# Patient Record
Sex: Female | Born: 1985 | Race: White | Hispanic: No | Marital: Married | Smoking: Former smoker
Health system: Southern US, Community
[De-identification: ages and names within clinical notes are randomized; demographics above are authoritative.]

## PROBLEM LIST (undated history)

## (undated) ENCOUNTER — Inpatient Hospital Stay (HOSPITAL_COMMUNITY): Payer: Self-pay

## (undated) DIAGNOSIS — G43909 Migraine, unspecified, not intractable, without status migrainosus: Secondary | ICD-10-CM

## (undated) DIAGNOSIS — R569 Unspecified convulsions: Secondary | ICD-10-CM

## (undated) HISTORY — DX: Migraine, unspecified, not intractable, without status migrainosus: G43.909

## (undated) HISTORY — PX: CHOLECYSTECTOMY: SHX55

## (undated) HISTORY — DX: Unspecified convulsions: R56.9

## (undated) HISTORY — PX: TUBAL LIGATION: SHX77

---
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
## 2007-05-01 ENCOUNTER — Emergency Department (HOSPITAL_COMMUNITY): Admission: EM | Admit: 2007-05-01 | Discharge: 2007-05-01 | Payer: Self-pay | Admitting: Emergency Medicine

## 2009-02-16 ENCOUNTER — Emergency Department (HOSPITAL_COMMUNITY): Admission: EM | Admit: 2009-02-16 | Discharge: 2009-02-16 | Payer: Self-pay | Admitting: Emergency Medicine

## 2009-09-29 IMAGING — CR DG ANKLE COMPLETE 3+V*R*
3 series · 3 of 3 positions shown · non-contrast
Comparison: None

CLINICAL DATA: Right ankle pain after fall next field none

RIGHT ANKLE - COMPLETE 3+ VIEW

[t ankle joint ap right]
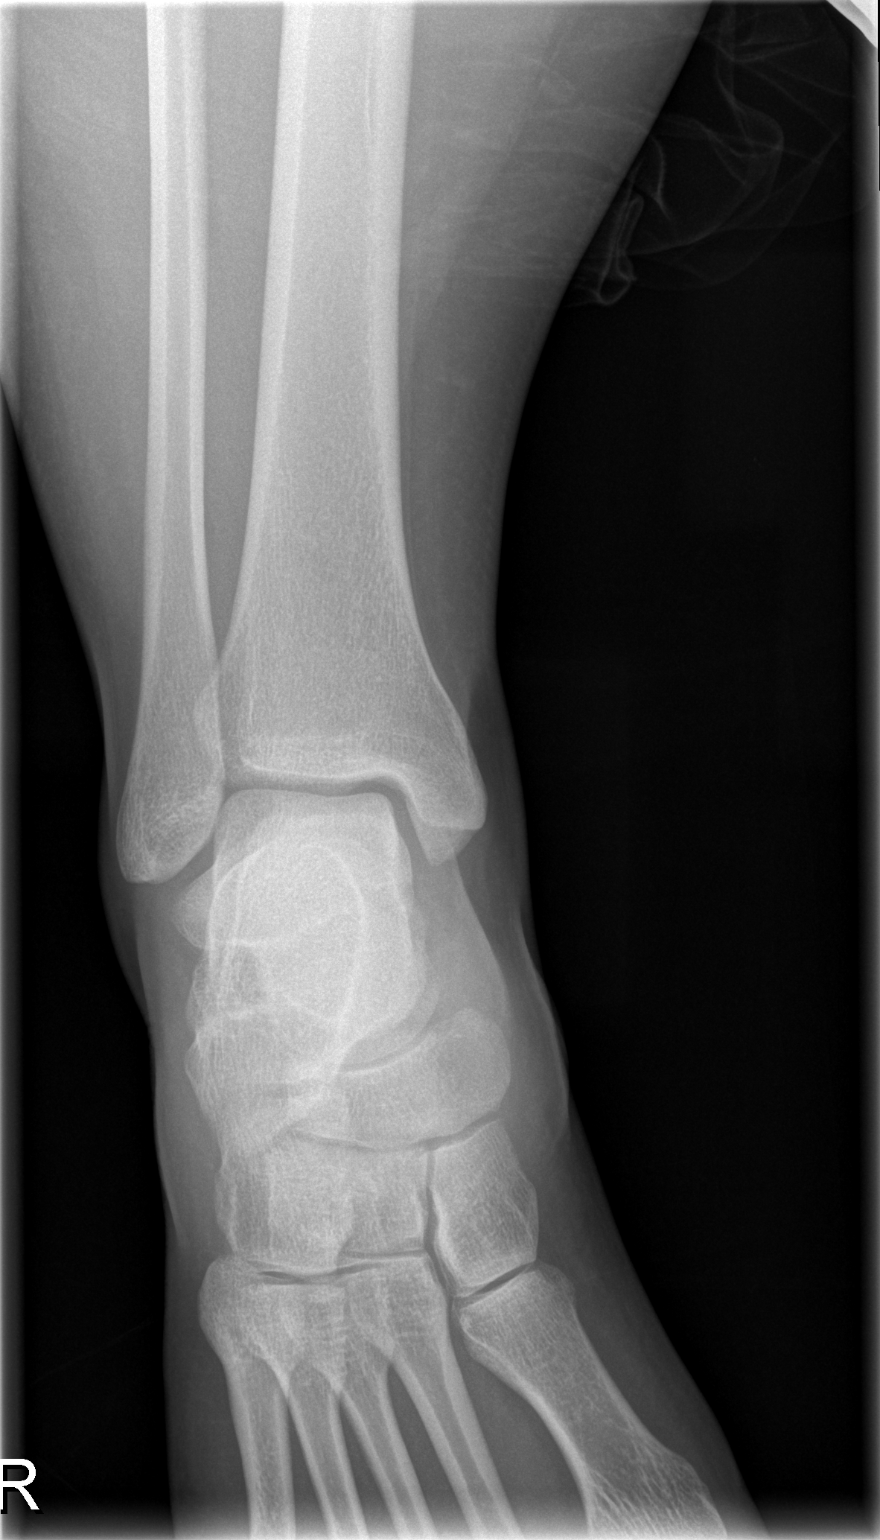

[t ankle joint oblique right]
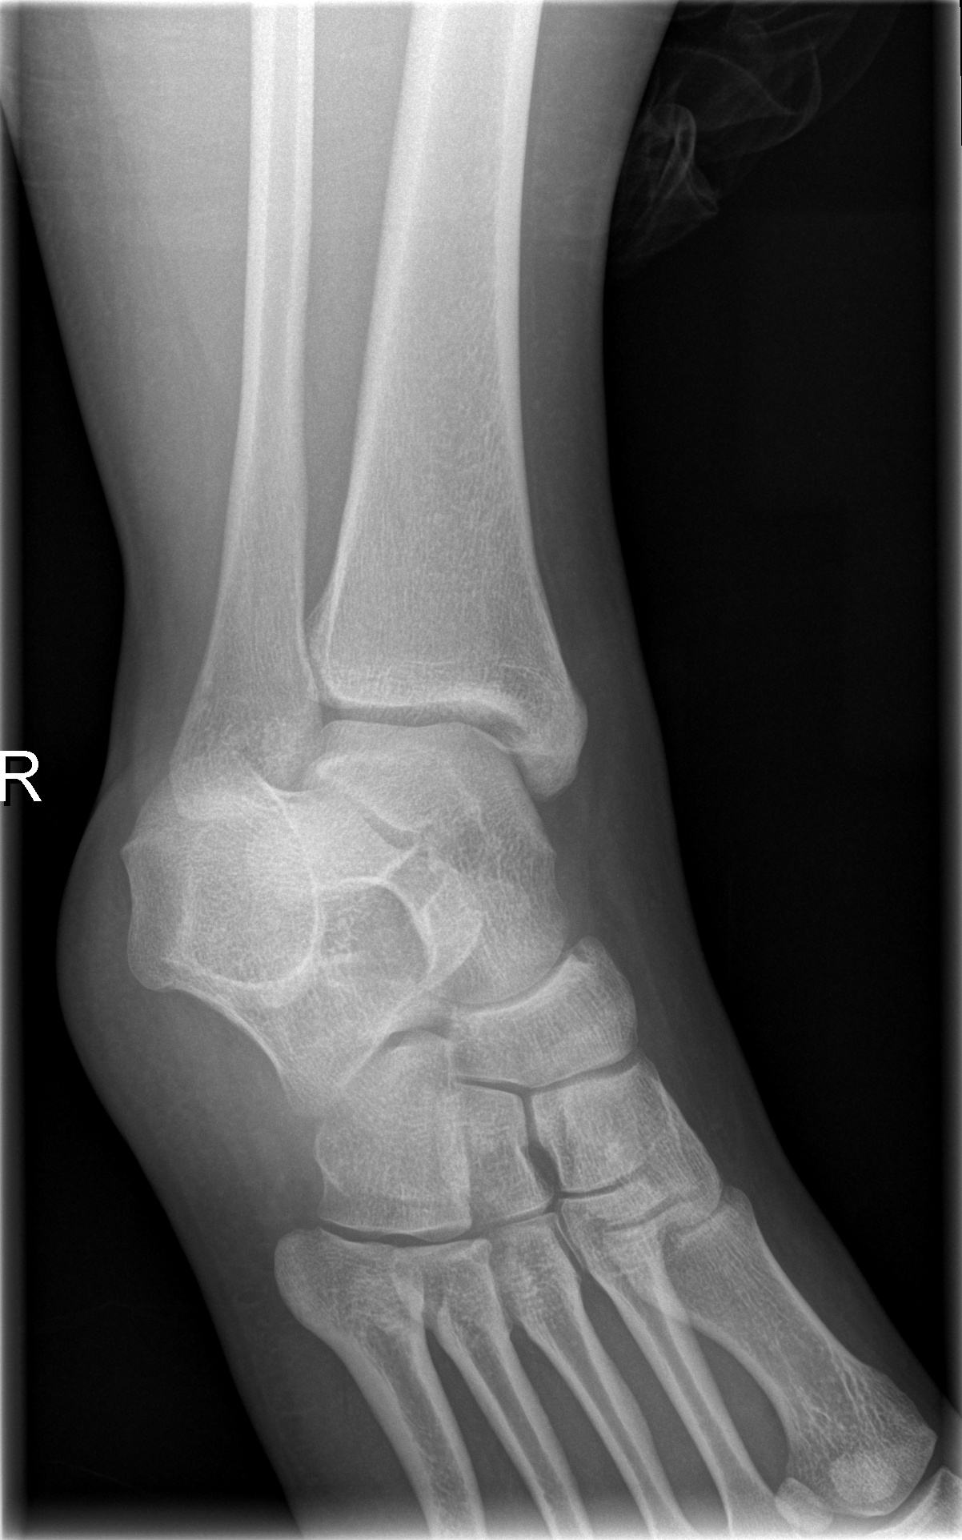

[t ankle joint lat right]
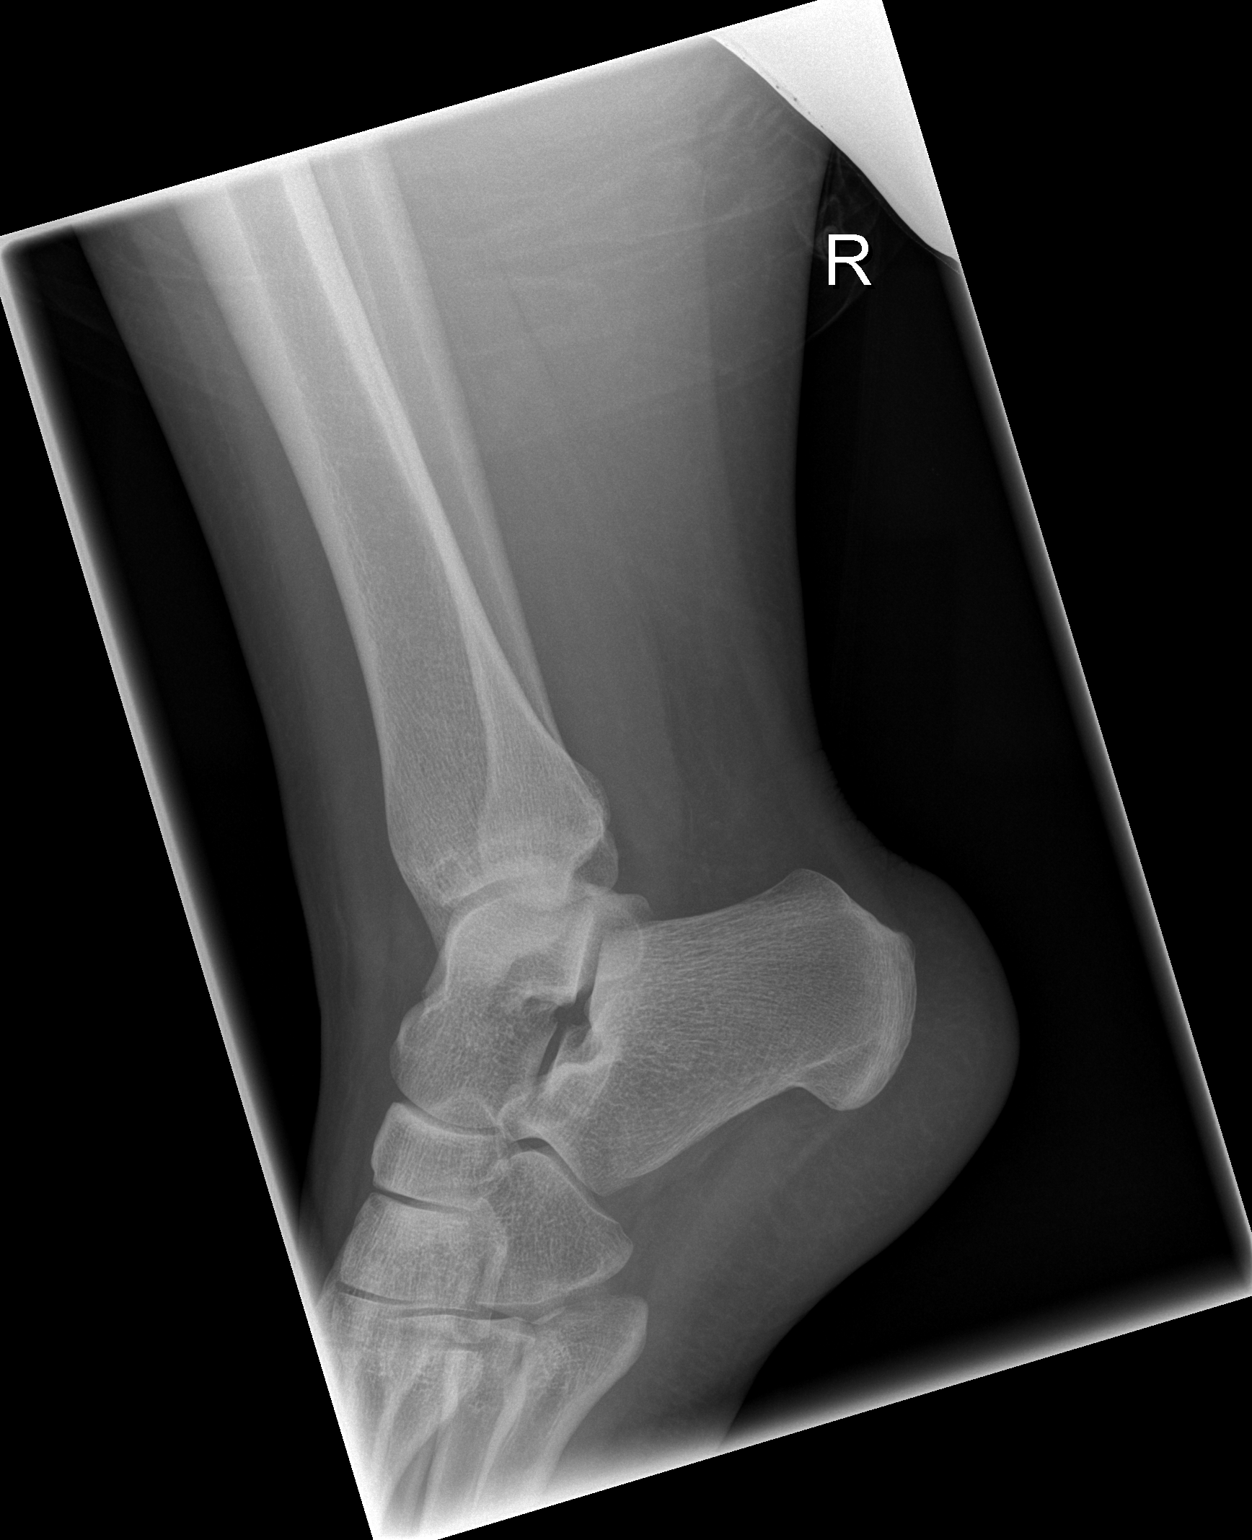

[3 of 3 positions shown; findings below may reference images not displayed]

FINDINGS: No acute fracture or dislocation.  Soft tissues are
unremarkable.
IMPRESSION: No acute findings.

## 2016-10-26 DIAGNOSIS — Z6841 Body Mass Index (BMI) 40.0 and over, adult: Secondary | ICD-10-CM | POA: Insufficient documentation

## 2017-07-05 ENCOUNTER — Encounter: Payer: Self-pay | Admitting: Diagnostic Neuroimaging

## 2017-07-05 ENCOUNTER — Ambulatory Visit (INDEPENDENT_AMBULATORY_CARE_PROVIDER_SITE_OTHER): Payer: Medicaid Other | Admitting: Diagnostic Neuroimaging

## 2017-07-05 VITALS — BP 124/90 | HR 72 | Ht 62.0 in | Wt 258.2 lb

## 2017-07-05 DIAGNOSIS — G40909 Epilepsy, unspecified, not intractable, without status epilepticus: Secondary | ICD-10-CM | POA: Diagnosis not present

## 2017-07-05 MED ORDER — LEVETIRACETAM 500 MG PO TABS: 500.0000 mg | ORAL_TABLET | Freq: Two times a day (BID) | ORAL | 12 refills | Status: DC

## 2017-07-05 NOTE — Progress Notes (Signed)
[facility]  PATIENT: Bethany Fleming DOB: Feb 23, 1986  REFERRING CLINICIAN: Brigitte Pulse, MD HISTORY FROM: patient and husband REASON FOR VISIT: new consult    HISTORICAL  CHIEF COMPLAINT:  Chief Complaint  Patient presents with  . Seizures    rm 6, New Pt, husband- [name], "seizures x 2 years, never been on seizure medication; mainly with stress; head feels like it is tingling beofre the seizure"    HISTORY OF PRESENT ILLNESS:   31 year old female here for evaluation of seizures.  Since 2016 patient has had 10-15 episodes of loss of consciousness with convulsions. She has had other episodes of memory lapses as well. These occur during daytime or while she is sleeping. Patient had a seizure during sleep, which patient's husband witnessed in July 2018; she then was staring, blinking and had a second seizure, before going to the emergency room, and was diagnosed with possible new onset seizure and referred to PCP. Patient was then referred to neurology as well. Patient has family history of seizure in her own mother and son. No specific triggering or aggravating factors. Last Tuesday patient had an episode where she will remembers getting into the shower, but woke up on the bottom of the shower with her dog standing over her licking her face. Patient is not sure how she passed out. She had soreness on her head and thinks she hit it when she fell down.   REVIEW OF SYSTEMS: Full 14 system review of systems performed and negative with exception of: Sleepiness headache seizure passing out not asleep decreased energy change in appetite increased thirst weight loss fatigue.  ALLERGIES: Allergies  Allergen Reactions  . Citrullus Vulgaris Anaphylaxis    unkown  . Prunus Persica Anaphylaxis    unknown  . Tomato Anaphylaxis    unknown    HOME MEDICATIONS: No outpatient prescriptions prior to visit.   No facility-administered medications prior to visit.     PAST  MEDICAL HISTORY: Past Medical History:  Diagnosis Date  . Migraine   . Seizures (HCC)    most recent possibly 06/28/17    PAST SURGICAL HISTORY: Past Surgical History:  Procedure Laterality Date  . CHOLECYSTECTOMY      FAMILY HISTORY: Family History  Problem Relation Age of Onset  . Cancer Mother        breast, lung  . Diabetes Mother   . Seizures Mother   . Stroke Father   . Cancer Father        hepatic  . Cerebral palsy Sister     SOCIAL HISTORY:  Social History   Social History  . Marital status: Married    Spouse name: [name]  . Number of children: 5  . Years of education: N/A   Occupational History  .      NA   Social History Main Topics  . Smoking status: Former Smoker    Types: Cigarettes    Start date: 07/05/2010    Quit date: 07/05/2012  . Smokeless tobacco: Never Used  . Alcohol use No  . Drug use: No  . Sexual activity: Not on file   Other Topics Concern  . Not on file   Social History Narrative   Lives with family   Caffeine- sometimes     PHYSICAL EXAM  GENERAL EXAM/CONSTITUTIONAL: Vitals:  Vitals:   07/05/17 1103  BP: 124/90  Pulse: 72  Weight: 258 lb 3.2 oz (117.1 kg)  Height:  (1.575 m)     Body mass  index is 47.23 kg/m.  Visual Acuity Screening   Right eye Left eye Both eyes  Without correction: 20/70 20/100   With correction:     Comments: 07/05/17 has glasses, not with her    Patient is in no distress; well developed, nourished and groomed; neck is supple  CARDIOVASCULAR:  Examination of carotid arteries is normal; no carotid bruits  Regular rate and rhythm, no murmurs  Examination of peripheral vascular system by observation and palpation is normal  EYES:  Ophthalmoscopic exam of optic discs and posterior segments is normal; no papilledema or hemorrhages  MUSCULOSKELETAL:  Gait, strength, tone, movements noted in Neurologic exam below  NEUROLOGIC: MENTAL STATUS:  No flowsheet data  found.  awake, alert, oriented to person, place and time  recent and remote memory intact  normal attention and concentration  language fluent, comprehension intact, naming intact,   fund of knowledge appropriate  CRANIAL NERVE:   2nd - no papilledema on fundoscopic exam  2nd, 3rd, 4th, 6th - pupils equal and reactive to light, visual fields full to confrontation, extraocular muscles intact, no nystagmus  5th - facial sensation symmetric  7th - facial strength symmetric  8th - hearing intact  9th - palate elevates symmetrically, uvula midline  11th - shoulder shrug symmetric  12th - tongue protrusion midline  MOTOR:   normal bulk and tone, full strength in the BUE, BLE  SENSORY:   normal and symmetric to light touch, temperature, vibration  COORDINATION:   finger-nose-finger, fine finger movements normal  REFLEXES:   deep tendon reflexes present and symmetric  GAIT/STATION:   narrow based gait; romberg is negative    DIAGNOSTIC DATA (LABS, IMAGING, TESTING) - I reviewed patient records, labs, notes, testing and imaging myself where available.  No results found for: WBC, HGB, HCT, MCV, PLT No results found for: NA, K, CL, CO2, GLUCOSE, BUN, CREATININE, CALCIUM, PROT, ALBUMIN, AST, ALT, ALKPHOS, BILITOT, GFRNONAA, GFRAA No results found for: CHOL, HDL, LDLCALC, LDLDIRECT, TRIG, CHOLHDL No results found for: ZOXW9U No results found for: VITAMINB12 No results found for: TSH   04/25/17 CT head [I reviewed images myself and agree with interpretation. -VRP]  - no acute findings    ASSESSMENT AND PLAN  31 y.o. year old female here with multiple episodes of loss of consciousness with convulsions, postictal confusion since 2016, consistent with seizure disorder.   Dx: seizure disorder  1. Seizure disorder (HCC)      PLAN:  - check MRI brain and EEG  - start levetiracetam  twice a day  - According to [place] law, you can not drive unless you  are seizure / syncope free for at least 6 months and under physician's care.   - Please maintain precautions. Do not participate in activities where a loss of awareness could harm you or someone else. No swimming alone, no tub bathing, no hot tubs, no driving, no operating motorized vehicles (cars, ATVs, motocycles, etc), lawnmowers, power tools or firearms. No standing at heights, such as rooftops, ladders or stairs. Avoid hot objects such as stoves, heaters, open fires. Wear a helmet when riding a bicycle, scooter, skateboard, etc. and avoid areas of traffic. Set your water heater to 120 degrees or less.  Orders Placed This Encounter  Procedures  . MR BRAIN W WO CONTRAST  . EEG adult   Meds ordered this encounter  Medications  . levETIRAcetam (KEPPRA) 500 MG tablet    Sig: Take 1 tablet (500 mg total) by mouth 2 (two)  times daily.    Dispense:  60 tablet    Refill:  12   Return in about 3 months (around 10/05/2017).  I reviewed images, labs, notes, records myself. I summarized findings and reviewed with patient, for this high risk condition (seizure disorder) requiring high complexity decision making.    Suanne Marker, MD 07/05/2017, 11:58 AM Certified in Neurology, Neurophysiology and Neuroimaging  [facility] [address], [address] [place], [place] [address] [phone]

## 2017-07-05 NOTE — Patient Instructions (Signed)
-   check MRI brain and EEG  - start levetiracetam  twice a day  - According to [place] law, you can not drive unless you are seizure / syncope free for at least 6 months and under physician's care.   - Please maintain precautions. Do not participate in activities where a loss of awareness could harm you or someone else. No swimming alone, no tub bathing, no hot tubs, no driving, no operating motorized vehicles (cars, ATVs, motocycles, etc), lawnmowers, power tools or firearms. No standing at heights, such as rooftops, ladders or stairs. Avoid hot objects such as stoves, heaters, open fires. Wear a helmet when riding a bicycle, scooter, skateboard, etc. and avoid areas of traffic. Set your water heater to 120 degrees or less.

## 2017-07-17 ENCOUNTER — Other Ambulatory Visit: Payer: Self-pay

## 2017-07-19 ENCOUNTER — Inpatient Hospital Stay (HOSPITAL_COMMUNITY)
Admission: AD | Admit: 2017-07-19 | Discharge: 2017-07-19 | Disposition: A | Discharge status: Home / Self Care | Payer: Medicaid Other | Source: Ambulatory Visit | Attending: Obstetrics and Gynecology | Admitting: Obstetrics and Gynecology

## 2017-07-19 ENCOUNTER — Encounter (HOSPITAL_COMMUNITY): Payer: Self-pay | Admitting: *Deleted

## 2017-07-19 DIAGNOSIS — G40909 Epilepsy, unspecified, not intractable, without status epilepticus: Secondary | ICD-10-CM | POA: Diagnosis not present

## 2017-07-19 DIAGNOSIS — R1010 Upper abdominal pain, unspecified: Secondary | ICD-10-CM | POA: Insufficient documentation

## 2017-07-19 DIAGNOSIS — O26899 Other specified pregnancy related conditions, unspecified trimester: Secondary | ICD-10-CM

## 2017-07-19 DIAGNOSIS — O26891 Other specified pregnancy related conditions, first trimester: Secondary | ICD-10-CM | POA: Insufficient documentation

## 2017-07-19 DIAGNOSIS — Z3A01 Less than 8 weeks gestation of pregnancy: Secondary | ICD-10-CM | POA: Insufficient documentation

## 2017-07-19 DIAGNOSIS — O99351 Diseases of the nervous system complicating pregnancy, first trimester: Secondary | ICD-10-CM | POA: Insufficient documentation

## 2017-07-19 DIAGNOSIS — O3680X0 Pregnancy with inconclusive fetal viability, not applicable or unspecified: Secondary | ICD-10-CM

## 2017-07-19 DIAGNOSIS — R109 Unspecified abdominal pain: Secondary | ICD-10-CM

## 2017-07-19 DIAGNOSIS — Z87891 Personal history of nicotine dependence: Secondary | ICD-10-CM | POA: Diagnosis not present

## 2017-07-19 LAB — URINALYSIS, ROUTINE W REFLEX MICROSCOPIC
Bilirubin Urine: NEGATIVE
Glucose, UA: NEGATIVE mg/dL
Hgb urine dipstick: NEGATIVE
KETONES UR: NEGATIVE mg/dL
Nitrite: NEGATIVE
PROTEIN: NEGATIVE mg/dL
Specific Gravity, Urine: 1.015 (ref 1.005–1.030)
pH: 5 (ref 5.0–8.0)

## 2017-07-19 LAB — CBC
HCT: 39.6 % (ref 36.0–46.0)
Hemoglobin: 13.4 g/dL (ref 12.0–15.0)
MCH: 28.5 pg (ref 26.0–34.0)
MCHC: 33.8 g/dL (ref 30.0–36.0)
MCV: 84.1 fL (ref 78.0–100.0)
PLATELETS: 231 10*3/uL (ref 150–400)
RBC: 4.71 MIL/uL (ref 3.87–5.11)
RDW: 12.8 % (ref 11.5–15.5)
WBC: 6.9 10*3/uL (ref 4.0–10.5)

## 2017-07-19 LAB — WET PREP, GENITAL
Clue Cells Wet Prep HPF POC: NONE SEEN
SPERM: NONE SEEN
TRICH WET PREP: NONE SEEN
YEAST WET PREP: NONE SEEN

## 2017-07-19 LAB — POCT PREGNANCY, URINE: PREG TEST UR: POSITIVE — AB

## 2017-07-19 LAB — HCG, QUANTITATIVE, PREGNANCY: hCG, Beta Chain, Quant, S: 46 m[IU]/mL — ABNORMAL HIGH (ref <5)

## 2017-07-19 MED ORDER — ACETAMINOPHEN 500 MG PO TABS: 1000.0000 mg | ORAL_TABLET | Freq: Four times a day (QID) | ORAL | 0 refills | Status: DC | PRN

## 2017-07-19 NOTE — MAU Note (Addendum)
States has been taking oral BCP and generic Kepra. Expresses concern over having +HPT Wants to make sure she is pregnant and that everything is ok.  Pain in upper mid abdomen Dull constant pain Rating pain 4/10  +N/V

## 2017-07-19 NOTE — Progress Notes (Signed)
BP 139/74 (BP Location: Right Arm)   Pulse 72   Temp 98.1 F (36.7 C)   Resp 18   Wt 116.2 kg (256 lb 1.9 oz)   LMP 05/18/2017   SpO2 100%   BMI 46.84 kg/m   Results for orders placed or performed during the hospital encounter of 07/19/17 (from the past 24 hour(s))  Urinalysis, Routine w reflex microscopic     Status: Abnormal   Collection Time: 07/19/17  2:49 PM  Result Value Ref Range   Color, Urine YELLOW YELLOW   APPearance HAZY (A) CLEAR   Specific Gravity, Urine 1.015 1.005 - 1.030   pH 5.0 5.0 - 8.0   Glucose, UA NEGATIVE NEGATIVE mg/dL   Hgb urine dipstick NEGATIVE NEGATIVE   Bilirubin Urine NEGATIVE NEGATIVE   Ketones, ur NEGATIVE NEGATIVE mg/dL   Protein, ur NEGATIVE NEGATIVE mg/dL   Nitrite NEGATIVE NEGATIVE   Leukocytes, UA SMALL (A) NEGATIVE   RBC / HPF 0-5 0 - 5 RBC/hpf   WBC, UA 6-30 0 - 5 WBC/hpf   Bacteria, UA RARE (A) NONE SEEN   Squamous Epithelial / LPF 6-30 (A) NONE SEEN   Mucus PRESENT   Pregnancy, urine POC     Status: Abnormal   Collection Time: 07/19/17  3:10 PM  Result Value Ref Range   Preg Test, Ur POSITIVE (A) NEGATIVE  Wet prep, genital     Status: Abnormal   Collection Time: 07/19/17  3:42 PM  Result Value Ref Range   Yeast Wet Prep HPF POC NONE SEEN NONE SEEN   Trich, Wet Prep NONE SEEN NONE SEEN   Clue Cells Wet Prep HPF POC NONE SEEN NONE SEEN   WBC, Wet Prep HPF POC MODERATE (A) NONE SEEN   Sperm NONE SEEN   hCG, quantitative, pregnancy     Status: Abnormal   Collection Time: 07/19/17  4:16 PM  Result Value Ref Range   hCG, Beta Chain, Quant, S 46 (H) <5 mIU/mL  CBC     Status: None   Collection Time: 07/19/17  4:16 PM  Result Value Ref Range   WBC 6.9 4.0 - 10.5 K/uL   RBC 4.71 3.87 - 5.11 MIL/uL   Hemoglobin 13.4 12.0 - 15.0 g/dL   HCT 16.1 09.6 - 04.5 %   MCV 84.1 78.0 - 100.0 fL   MCH 28.5 26.0 - 34.0 pg   MCHC 33.8 30.0 - 36.0 g/dL   RDW 40.9 81.1 - 91.4 %   Platelets 231 150 - 400 K/uL   I reviewed the implication of  a quantitative hCG of 46. She understands that she may be very early pregnant. She also understands that she may be having a miscarriage or some other complication of her pregnancy. We will follow the patient with quantitative hCG values every 48 hours. She will have her blood drawn in [place], [place]. She will return to our office in 2 weeks.

## 2017-07-19 NOTE — MAU Provider Note (Signed)
DATE: 07/19/2017  Maternity Admissions Unit History and Physical Exam for an Obstetrics Patient  [name] is a 31 y.o. female, G6P4100, at Unknown gestation, who presents for evaluation of upper abdominal pain and pregnancy of uncertain gestation. She has been followed at the [facility] and Gynecology division of [facility]. She conceived while on birth control pills. Her blood type is O+.  OB History    Gravida Para Term Preterm AB Living   SAB TAB Ectopic Multiple Live Births                  Past Medical History:  Diagnosis Date  . Migraine   . Seizures (HCC)    most recent possibly 06/28/17    Prescriptions Prior to Admission  Medication Sig Dispense Refill Last Dose  . levETIRAcetam (KEPPRA) 500 MG tablet Take 1 tablet (500 mg total) by mouth 2 (two) times daily. 60 tablet 12   . ondansetron (ZOFRAN-ODT) 4 MG disintegrating tablet Take 4 mg by mouth.   Taking    Past Surgical History:  Procedure Laterality Date  . CHOLECYSTECTOMY      Allergies  Allergen Reactions  . Citrullus Vulgaris Anaphylaxis    unkown  . Prunus Persica Anaphylaxis    unknown  . Tomato Anaphylaxis    unknown    Family History: family history includes Cancer in her father and mother; Cerebral palsy in her sister; Diabetes in her mother; Seizures in her mother; Stroke in her father.  Social History:  reports that she quit smoking about 5 years ago. Her smoking use included Cigarettes. She started smoking about 7 years ago. She has never used smokeless tobacco. She reports that she does not drink alcohol or use drugs.  Review of systems: Normal pregnancy complaints.  Admission Physical Exam:  Dilation: Closed Exam by:: Stefano Gaul, MD  Body mass index is 46.84 kg/m.  Blood pressure 139/74, pulse 80, temperature 98.2 F (36.8 C), temperature source Oral, resp. rate 18, weight 116.2 kg (256 lb 1.9 oz), last menstrual period  05/18/2017, SpO2 100 %.  HEENT:                 Within normal limits Chest:                   Clear Heart:                    Regular rate and rhythm Abdomen:             Gravid and nontender Extremities:          Grossly normal Neurologic exam: Grossly normal Pelvic exam:         Cervix: closed and long The uterus is normal size as best I can tell (exam is limited by BMI)  Assessment:  4 week and 3 day gestation by uncertain last menstrual period  Vague upper abdominal pain  Seizure disorder  Desire to know gestational age  Plan:  We will check a CBC, quantitative hCG, and possible ultrasound.  The patient will continue her current seizure medication. She will speak with her neurologist as soon as possible to plan to transition to a a seizure medication that is most compatible with pregnancy.   Janine Limbo 07/19/2017, 4:12 PM

## 2017-07-19 NOTE — Progress Notes (Signed)
Dr Stefano Gaul in to discuss test results and d/c plan.

## 2017-07-19 NOTE — Discharge Instructions (Signed)
Vaginal Bleeding During Pregnancy, First Trimester °A small amount of bleeding (spotting) from the vagina is common in early pregnancy. Sometimes the bleeding is normal and is not a problem, and sometimes it is a sign of something serious. Be sure to tell your doctor about any bleeding from your vagina right away. °Follow these instructions at home: °· Watch your condition for any changes. °· Follow your doctor's instructions about how active you can be. °· If you are on bed rest: °? You may need to stay in bed and only get up to use the bathroom. °? You may be allowed to do some activities. °? If you need help, make plans for someone to help you. °· Write down: °? The number of pads you use each day. °? How often you change pads. °? How soaked (saturated) your pads are. °· Do not use tampons. °· Do not douche. °· Do not have sex or orgasms until your doctor says it is okay. °· If you pass any tissue from your vagina, save the tissue so you can show it to your doctor. °· Only take medicines as told by your doctor. °· Do not take aspirin because it can make you bleed. °· Keep all follow-up visits as told by your doctor. °Contact a doctor if: °· You bleed from your vagina. °· You have cramps. °· You have labor pains. °· You have a fever that does not go away after you take medicine. °Get help right away if: °· You have very bad cramps in your back or belly (abdomen). °· You pass large clots or tissue from your vagina. °· You bleed more. °· You feel light-headed or weak. °· You pass out (faint). °· You have chills. °· You are leaking fluid or have a gush of fluid from your vagina. °· You pass out while pooping (having a bowel movement). °This information is not intended to replace advice given to you by your health care provider. Make sure you discuss any questions you have with your health care provider. °Document Released: 02/04/2014 Document Revised: 02/26/2016 Document Reviewed: 05/28/2013 °Elsevier Interactive  Patient Education © 2018 Elsevier Inc. ° °

## 2017-07-19 NOTE — Progress Notes (Signed)
Written and verbal d/c instructions given and understanding voiced. 

## 2017-07-20 ENCOUNTER — Other Ambulatory Visit: Payer: Medicaid Other

## 2017-07-20 LAB — CULTURE, OB URINE

## 2017-07-20 LAB — GC/CHLAMYDIA PROBE AMP (~~LOC~~) NOT AT ARMC
Chlamydia: NEGATIVE
NEISSERIA GONORRHEA: NEGATIVE

## 2017-07-22 ENCOUNTER — Inpatient Hospital Stay (HOSPITAL_COMMUNITY)
Admission: AD | Admit: 2017-07-22 | Discharge: 2017-07-22 | Disposition: A | Discharge status: Home / Self Care | Payer: Medicaid Other | Source: Ambulatory Visit | Attending: Obstetrics and Gynecology | Admitting: Obstetrics and Gynecology

## 2017-07-22 DIAGNOSIS — Z3A01 Less than 8 weeks gestation of pregnancy: Secondary | ICD-10-CM | POA: Diagnosis not present

## 2017-07-22 DIAGNOSIS — O26891 Other specified pregnancy related conditions, first trimester: Secondary | ICD-10-CM | POA: Diagnosis not present

## 2017-07-22 LAB — HCG, QUANTITATIVE, PREGNANCY: HCG, BETA CHAIN, QUANT, S: 145 m[IU]/mL — AB (ref <5)

## 2017-07-22 NOTE — MAU Note (Signed)
NO PAIN , NO BLEEDING.

## 2017-08-03 ENCOUNTER — Telehealth: Payer: Self-pay | Admitting: Diagnostic Neuroimaging

## 2017-08-03 ENCOUNTER — Other Ambulatory Visit: Payer: Self-pay | Admitting: Neurology

## 2017-08-03 DIAGNOSIS — R569 Unspecified convulsions: Secondary | ICD-10-CM

## 2017-08-03 NOTE — Telephone Encounter (Signed)
This is a Dr. Marjory LiesPenumalli patient, Bethany [facility] just called me and informed me that the patient is [redacted] week pregnant and cannot have contrast. When you get a chance can you put a new order to a MRI Brain wo contrast.

## 2017-08-03 NOTE — Telephone Encounter (Signed)
Order is placed.

## 2017-08-03 NOTE — Telephone Encounter (Signed)
Noted thank you

## 2017-08-08 ENCOUNTER — Inpatient Hospital Stay (HOSPITAL_COMMUNITY)
Admission: AD | Admit: 2017-08-08 | Discharge: 2017-08-09 | Disposition: A | Discharge status: Home / Self Care | Payer: Medicaid Other | Source: Ambulatory Visit | Attending: Obstetrics and Gynecology | Admitting: Obstetrics and Gynecology

## 2017-08-08 ENCOUNTER — Encounter (HOSPITAL_COMMUNITY): Payer: Self-pay | Admitting: Certified Nurse Midwife

## 2017-08-08 DIAGNOSIS — Z91018 Allergy to other foods: Secondary | ICD-10-CM | POA: Insufficient documentation

## 2017-08-08 DIAGNOSIS — Z87891 Personal history of nicotine dependence: Secondary | ICD-10-CM | POA: Diagnosis not present

## 2017-08-08 DIAGNOSIS — Z9049 Acquired absence of other specified parts of digestive tract: Secondary | ICD-10-CM | POA: Diagnosis not present

## 2017-08-08 DIAGNOSIS — Z833 Family history of diabetes mellitus: Secondary | ICD-10-CM | POA: Diagnosis not present

## 2017-08-08 DIAGNOSIS — R109 Unspecified abdominal pain: Secondary | ICD-10-CM | POA: Diagnosis not present

## 2017-08-08 DIAGNOSIS — Z823 Family history of stroke: Secondary | ICD-10-CM | POA: Insufficient documentation

## 2017-08-08 DIAGNOSIS — O26891 Other specified pregnancy related conditions, first trimester: Secondary | ICD-10-CM | POA: Insufficient documentation

## 2017-08-08 DIAGNOSIS — R1031 Right lower quadrant pain: Secondary | ICD-10-CM | POA: Insufficient documentation

## 2017-08-08 DIAGNOSIS — Z803 Family history of malignant neoplasm of breast: Secondary | ICD-10-CM | POA: Insufficient documentation

## 2017-08-08 DIAGNOSIS — Z79899 Other long term (current) drug therapy: Secondary | ICD-10-CM | POA: Diagnosis not present

## 2017-08-08 DIAGNOSIS — O99351 Diseases of the nervous system complicating pregnancy, first trimester: Secondary | ICD-10-CM | POA: Insufficient documentation

## 2017-08-08 DIAGNOSIS — G40909 Epilepsy, unspecified, not intractable, without status epilepticus: Secondary | ICD-10-CM | POA: Insufficient documentation

## 2017-08-08 DIAGNOSIS — Z801 Family history of malignant neoplasm of trachea, bronchus and lung: Secondary | ICD-10-CM | POA: Insufficient documentation

## 2017-08-08 DIAGNOSIS — Z820 Family history of epilepsy and other diseases of the nervous system: Secondary | ICD-10-CM | POA: Diagnosis not present

## 2017-08-08 DIAGNOSIS — Z3A08 8 weeks gestation of pregnancy: Secondary | ICD-10-CM | POA: Diagnosis not present

## 2017-08-08 DIAGNOSIS — O219 Vomiting of pregnancy, unspecified: Secondary | ICD-10-CM | POA: Diagnosis present

## 2017-08-08 LAB — URINALYSIS, ROUTINE W REFLEX MICROSCOPIC
Bilirubin Urine: NEGATIVE
GLUCOSE, UA: NEGATIVE mg/dL
HGB URINE DIPSTICK: NEGATIVE
KETONES UR: NEGATIVE mg/dL
NITRITE: NEGATIVE
PH: 6 (ref 5.0–8.0)
PROTEIN: NEGATIVE mg/dL
Specific Gravity, Urine: 1.02 (ref 1.005–1.030)

## 2017-08-08 LAB — WET PREP, GENITAL
Clue Cells Wet Prep HPF POC: NONE SEEN
Sperm: NONE SEEN
Trich, Wet Prep: NONE SEEN
Yeast Wet Prep HPF POC: NONE SEEN

## 2017-08-08 LAB — CBC
HCT: 39.6 % (ref 36.0–46.0)
HEMOGLOBIN: 13.4 g/dL (ref 12.0–15.0)
MCH: 28.5 pg (ref 26.0–34.0)
MCHC: 33.8 g/dL (ref 30.0–36.0)
MCV: 84.3 fL (ref 78.0–100.0)
Platelets: 231 10*3/uL (ref 150–400)
RBC: 4.7 MIL/uL (ref 3.87–5.11)
RDW: 13.1 % (ref 11.5–15.5)
WBC: 7.9 10*3/uL (ref 4.0–10.5)

## 2017-08-08 LAB — HCG, QUANTITATIVE, PREGNANCY: HCG, BETA CHAIN, QUANT, S: 38787 m[IU]/mL — AB (ref <5)

## 2017-08-08 LAB — ABO/RH: ABO/RH(D): O POS

## 2017-08-08 NOTE — MAU Note (Signed)
PT SAYS SHE WAS HERE  2 WEEKS  AGO - FOR  LABS  -  TOLD  TO COME  BACK 11-3   FOR  EDC.     PNC - NO APPOINTMENT  YET.    CRAMPS  STARTED    SAT NIGHT

## 2017-08-08 NOTE — MAU Provider Note (Signed)
History    CSN: [id number] Arrival date and time: 08/08/17 2012    Chief Complaint  Patient presents with  . Abdominal Pain    HPI: Bethany Fleming is a 31 y.o. [phone] [id number] with IUP at [id number] who presents to maternity for evaluation of lower abdominal pain for 2 days. She reports that after twisting and jumping suddenly at work (works at a [employer]) she started feeling sharp pain in rlq with radiation to llq. 4/10 pain not alleviated with tylenol. No vaginal bleeding but has vaginal discharge white, thick that also started 2 days ago w/o itching. She reports some nausea, vomiting.  She stated that she was conceived while on birth control pills (she was compliant with medication)..  Denies contractions, leakage of fluid or vaginal bleeding.  Also denies any fevers, chills, sweats, dysuria or change in bowel movement. She reports that she has not received any PNC due to unable to find provider to manage Seizures in pregnancy.  Pregnancy complicated by Seizures (diagnosed 1 month ago. On keppra with last seizure last week lasting 5-336minutes as reported by family member  Past obstetric history: OB History  Gravida Para Term Preterm AB Living  6 5 4 1   5   SAB TAB Ectopic Multiple Live Births          5    # Outcome Date GA Lbr Len/2nd Weight Sex Delivery Anes PTL Lv  6 Current           5 Preterm 02/14/14    M Vag-Spont   LIV  4 Term 12/27/12    M Vag-Spont   LIV  3 Term 06/25/09    M Vag-Spont   LIV  2 Term 09/26/07    M Vag-Spont   LIV  1 Term 06/25/06    F Vag-Spont   LIV      Past Medical History:  Diagnosis Date  . Migraine   . Seizures (HCC)    most recent possibly 06/28/17    Past Surgical History:  Procedure Laterality Date  . CHOLECYSTECTOMY      Family History  Problem Relation Age of Onset  . Cancer Mother        breast, lung  . Diabetes Mother   . Seizures Mother   . Stroke Father   . Cancer Father        hepatic  . Cerebral palsy Sister     Social  History   Tobacco Use  . Smoking status: Former Smoker    Types: Cigarettes    Start date: 07/05/2010    Last attempt to quit: 07/05/2012    Years since quitting: 5.0  . Smokeless tobacco: Never Used  Substance Use Topics  . Alcohol use: No  . Drug use: No    Allergies:  Allergies  Allergen Reactions  . Other Anaphylaxis    Watermelon, peaches  . Tomato Anaphylaxis    unknown    Medications Prior to Admission  Medication Sig Dispense Refill Last Dose  . levETIRAcetam (KEPPRA) 500 MG tablet Take 1 tablet (500 mg total) by mouth 2 (two) times daily. 60 tablet 12 08/08/2017 at Unknown time  . acetaminophen (TYLENOL) 500 MG tablet Take 2 tablets (1,000 mg total) by mouth every 6 (six) hours as needed for moderate pain. 30 tablet 0   . ondansetron (ZOFRAN-ODT) 4 MG disintegrating tablet Take 4 mg by mouth every 8 (eight) hours as needed for nausea.    Past Week at Unknown time  Review of Systems - Negative except for what is mentioned in HPI.  Physical Exam   Blood pressure 113/67, pulse 71, temperature 98.3 F (36.8 C), temperature source Oral, resp. rate 18, height 5\' 2"  (1.575 m), weight 257 lb 12 oz (116.9 kg), last menstrual period 06/08/2017.  Constitutional: Well-developed, well-nourished female in no acute distress.  HENT: [place]/AT, normal oropharynx mucosa. MMM Eyes: normal conjunctivae, no scleral icterus Cardiovascular: normal rate, regular rhythm Respiratory: normal effort, lungs CTAB.  GI: Abd soft, LLQ tenderness, BS +   GU: Neg CVAT. Pelvic: NEFG, physiologic discharge, no blood, cervix clean. No CMT MSK: Extremities nontender, no edema, normal ROM Neurologic: Alert and oriented x 4. Psych: Normal mood and affect Skin: warm and dry    MAU Course  Procedures  MDM  31 yo f at [redacted]w[redacted]d with recent diagnosis of epilepsy on Keppra. With lower abdominal pain for 2 days without bleeding. Evaluation for abdominal pain in pregnancy, 1st trimester.  --HCG quant, CBC,  T&S -- wet prep and GC/Chlamydia probe amp --Transvaginal and transabdominal pelvic ultrasound.  Assessment and Plan  Assessment: 1. Abdominal pain during pregnancy in first trimester   2. Abdominal pain in pregnancy, first trimester   3. Seizure disorder during pregnancy in first trimester [facility])     Plan: --Discharge home in stable condition. --continue taking prenatal vitamins  --Call to schedule for new ob visit for prenatal care --call neurologist to schedule for follow up on epilepsy (recent seizure w/ medication)  Follow-up Information    [facility] Call.   Specialty:  Obstetrics and Gynecology Why:  call  as soon as possible for 1st prenatal visit Contact information: [address] [place] [address] [phone]           Minott, Lynnae Prude, MD 08/09/2017 1:58 AM   OB FELLOW MAU DISCHARGE ATTESTATION I have seen and examined this patient; I agree with above documentation in the resident's note.  Highlights:  Pt with positive pregnancy test 2 weeks ago, but no prior ultrasound presenting with abdominal pain. On exam, pt nontoxic appearing. VSS.  Work up done including labs and ultrasound to rule out ectopic pregnancy. US showed viable IUP at [redacted]w[redacted]d, and subchorionic hemorrhage. Pt w/o vaginal bleeding at this time. She is Rh+ --Pt d/c'd home in stable condition --Discussed precautions, including vaginal bleeding precautions.  --She has seizure disorder, recent diagnosis, currently on Keppra, had breakthrough seizure about a week ago. She was advised to call neurologist in the morning, for sooner follow up and medication adjustment. --List of OB providers to establish prenatal care.   Frederik Pear, MD OB Fellow 2:35 AM

## 2017-08-09 ENCOUNTER — Inpatient Hospital Stay (HOSPITAL_COMMUNITY): Payer: Medicaid Other

## 2017-08-09 DIAGNOSIS — O26891 Other specified pregnancy related conditions, first trimester: Secondary | ICD-10-CM

## 2017-08-09 DIAGNOSIS — G40909 Epilepsy, unspecified, not intractable, without status epilepticus: Secondary | ICD-10-CM

## 2017-08-09 DIAGNOSIS — O99351 Diseases of the nervous system complicating pregnancy, first trimester: Secondary | ICD-10-CM

## 2017-08-09 DIAGNOSIS — R109 Unspecified abdominal pain: Secondary | ICD-10-CM | POA: Diagnosis not present

## 2017-08-09 LAB — GC/CHLAMYDIA PROBE AMP (~~LOC~~) NOT AT ARMC
Chlamydia: NEGATIVE
Neisseria Gonorrhea: NEGATIVE

## 2017-08-09 NOTE — Discharge Instructions (Signed)
Prenatal Care WHAT IS PRENATAL CARE? Prenatal care is the process of caring for a pregnant woman before she gives birth. Prenatal care makes sure that she and her baby remain as healthy as possible throughout pregnancy. Prenatal care may be provided by a midwife, family practice health care provider, or a childbirth and pregnancy specialist (obstetrician). Prenatal care may include physical examinations, testing, treatments, and education on nutrition, lifestyle, and social support services. WHY IS PRENATAL CARE SO IMPORTANT? Early and consistent prenatal care increases the chance that you and your baby will remain healthy throughout your pregnancy. This type of care also decreases a babys risk of being born too early (prematurely), or being born smaller than expected (small for gestational age). Any underlying medical conditions you may have that could pose a risk during your pregnancy are discussed during prenatal care visits. You will also be monitored regularly for any new conditions that may arise during your pregnancy so they can be treated quickly and effectively. WHAT HAPPENS DURING PRENATAL CARE VISITS? Prenatal care visits may include the following: Discussion Tell your health care provider about any new signs or symptoms you have experienced since your last visit. These might include:  Nausea or vomiting.  Increased or decreased level of energy.  Difficulty sleeping.  Back or leg pain.  Weight changes.  Frequent urination.  Shortness of breath with physical activity.  Changes in your skin, such as the development of a rash or itchiness.  Vaginal discharge or bleeding.  Feelings of excitement or nervousness.  Changes in your babys movements.  You may want to write down any questions or topics you want to discuss with your health care provider and bring them with you to your appointment. Examination During your first prenatal care visit, you will likely have a complete  physical exam. Your health care provider will often examine your vagina, cervix, and the position of your uterus, as well as check your heart, lungs, and other body systems. As your pregnancy progresses, your health care provider will measure the size of your uterus and your babys position inside your uterus. He or she may also examine you for early signs of labor. Your prenatal visits may also include checking your blood pressure and, after about 10-12 weeks of pregnancy, listening to your babys heartbeat. Testing Regular testing often includes:  Urinalysis. This checks your urine for glucose, protein, or signs of infection.  Blood count. This checks the levels of white and red blood cells in your body.  Tests for sexually transmitted infections (STIs). Testing for STIs at the beginning of pregnancy is routinely done and is required in many states.  Antibody testing. You will be checked to see if you are immune to certain illnesses, such as rubella, that can affect a developing fetus.  Glucose screen. Around 24-28 weeks of pregnancy, your blood glucose level will be checked for signs of gestational diabetes. Follow-up tests may be recommended.  Group B strep. This is a bacteria that is commonly found inside a womans vagina. This test will inform your health care provider if you need an antibiotic to reduce the amount of this bacteria in your body prior to labor and childbirth.  Ultrasound. Many pregnant women undergo an ultrasound screening around 18-20 weeks of pregnancy to evaluate the health of the fetus and check for any developmental abnormalities.  HIV (human immunodeficiency virus) testing. Early in your pregnancy, you will be screened for HIV. If you are at high risk for HIV, this test may  be repeated during your third trimester of pregnancy.  You may be offered other testing based on your age, personal or family medical history, or other factors. HOW OFTEN SHOULD I PLAN TO SEE MY  HEALTH CARE PROVIDER FOR PRENATAL CARE? Your prenatal care check-up schedule depends on any medical conditions you have before, or develop during, your pregnancy. If you do not have any underlying medical conditions, you will likely be seen for checkups:  Monthly, during the first 6 months of pregnancy.  Twice a month during months 7 and 8 of pregnancy.  Weekly starting in the 9th month of pregnancy and until delivery.  If you develop signs of early labor or other concerning signs or symptoms, you may need to see your health care provider more often. Ask your health care provider what prenatal care schedule is best for you. WHAT CAN I DO TO KEEP MYSELF AND MY BABY AS HEALTHY AS POSSIBLE DURING MY PREGNANCY?  Take a prenatal vitamin containing 400 micrograms (0.4 mg) of folic acid every day. Your health care provider may also ask you to take additional vitamins such as iodine, vitamin D, iron, copper, and zinc.  Take 1500-2000 mg of calcium daily starting at your 20th week of pregnancy until you deliver your baby.  Make sure you are up to date on your vaccinations. Unless directed otherwise by your health care provider: ? You should receive a tetanus, diphtheria, and pertussis (Tdap) vaccination between the 27th and 36th week of your pregnancy, regardless of when your last Tdap immunization occurred. This helps protect your baby from whooping cough (pertussis) after he or she is born. ? You should receive an annual inactivated influenza vaccine (IIV) to help protect you and your baby from influenza. This can be done at any point during your pregnancy.  Eat a well-rounded diet that includes: ? Fresh fruits and vegetables. ? Lean proteins. ? Calcium-rich foods such as milk, yogurt, hard cheeses, and dark, leafy greens. ? Whole grain breads.  Do noteat seafood high in mercury, including: ? Swordfish. ? Tilefish. ? Shark. ? King mackerel. ? More than 6 oz tuna per week.  Do not  eat: ? Raw or undercooked meats or eggs. ? Unpasteurized foods, such as soft cheeses (brie, blue, or feta), juices, and milks. ? Lunch meats. ? Hot dogs that have not been heated until they are steaming.  Drink enough water to keep your urine clear or pale yellow. For many women, this may be 10 or more 8 oz glasses of water each day. Keeping yourself hydrated helps deliver nutrients to your baby and may prevent the start of pre-term uterine contractions.  Do not use any tobacco products including cigarettes, chewing tobacco, or electronic cigarettes. If you need help quitting, ask your health care provider.  Do not drink beverages containing alcohol. No safe level of alcohol consumption during pregnancy has been determined.  Do not use any illegal drugs. These can harm your developing baby or cause a miscarriage.  Ask your health care provider or pharmacist before taking any prescription or over-the-counter medicines, herbs, or supplements.  Limit your caffeine intake to no more than 200 mg per day.  Exercise. Unless told otherwise by your health care provider, try to get 30 minutes of moderate exercise most days of the week. Do not  do high-impact activities, contact sports, or activities with a high risk of falling, such as horseback riding or downhill skiing.  Get plenty of rest.  Avoid anything that raises your  body temperature, such as hot tubs and saunas.  If you own a cat, do not empty its litter box. Bacteria contained in cat feces can cause an infection called toxoplasmosis. This can result in serious harm to the fetus.  Stay away from chemicals such as insecticides, lead, mercury, and cleaning or paint products that contain solvents.  Do not have any X-rays taken unless medically necessary.  Take a childbirth and breastfeeding preparation class. Ask your health care provider if you need a referral or recommendation.  This information is not intended to replace advice given  to you by your health care provider. Make sure you discuss any questions you have with your health care provider. Document Released: 09/23/2003 Document Revised: 02/23/2016 Document Reviewed: 12/05/2013 Elsevier Interactive Patient Education  2017 Elsevier Inc. Abdominal Pain During Pregnancy Abdominal pain is common in pregnancy. Most of the time, it does not cause harm. There are many causes of abdominal pain. Some causes are more serious than others and sometimes the cause is not known. Abdominal pain can be a sign that something is very wrong with the pregnancy or the pain may have nothing to do with the pregnancy. Always tell your health care provider if you have any abdominal pain. Follow these instructions at home:  Do not have sex or put anything in your vagina until your symptoms go away completely.  Watch your abdominal pain for any changes.  Get plenty of rest until your pain improves.  Drink enough fluid to keep your urine clear or pale yellow.  Take over-the-counter or prescription medicines only as told by your health care provider.  Keep all follow-up visits as told by your health care provider. This is important. Contact a health care provider if:  You have a fever.  Your pain gets worse or you have cramping.  Your pain continues after resting. Get help right away if:  You are bleeding, leaking fluid, or passing tissue from the vagina.  You have vomiting or diarrhea that does not go away.  You have painful or bloody urination.  You notice a decrease in your baby's movements.  You feel very weak or faint.  You have shortness of breath.  You develop a severe headache with abdominal pain.  You have abnormal vaginal discharge with abdominal pain. This information is not intended to replace advice given to you by your health care provider. Make sure you discuss any questions you have with your health care provider. Document Released: 09/20/2005 Document Revised:  07/01/2016 Document Reviewed: 04/19/2013 Elsevier Interactive Patient Education  [employer]2018 Elsevier Inc.

## 2017-08-12 ENCOUNTER — Other Ambulatory Visit: Payer: Self-pay | Admitting: Neurology

## 2017-08-12 DIAGNOSIS — G43011 Migraine without aura, intractable, with status migrainosus: Secondary | ICD-10-CM

## 2017-08-12 DIAGNOSIS — R569 Unspecified convulsions: Secondary | ICD-10-CM

## 2017-08-12 DIAGNOSIS — R519 Headache, unspecified: Secondary | ICD-10-CM

## 2017-08-12 DIAGNOSIS — R51 Headache: Principal | ICD-10-CM

## 2017-08-14 ENCOUNTER — Other Ambulatory Visit: Payer: Medicaid Other

## 2017-08-14 ENCOUNTER — Ambulatory Visit
Admission: RE | Admit: 2017-08-14 | Discharge: 2017-08-14 | Disposition: A | Discharge status: Home / Self Care | Payer: Medicaid Other | Source: Ambulatory Visit | Attending: Neurology | Admitting: Neurology

## 2017-08-14 DIAGNOSIS — R569 Unspecified convulsions: Secondary | ICD-10-CM | POA: Diagnosis not present

## 2017-08-18 ENCOUNTER — Telehealth: Payer: Self-pay | Admitting: Neurology

## 2017-08-18 NOTE — Telephone Encounter (Signed)
Called the patient and made her aware of what the MRI showed. I informed her that based off of this she would like the patient to have a Echo with bubble study completed to look for a PFO. This will work the patient up and address the areas on the brain. Pt verbalized understanding.Pt had no questions at this time but was encouraged to call back if questions arise.

## 2017-08-18 NOTE — Telephone Encounter (Signed)
-----   Message from Melvyn Novasarmen Dohmeier, MD sent at 08/17/2017  8:40 AM EST ----- Non demyelinating lesions seen., likely microvascular changes or small emboli.   Baird LyonsCasey - will need to order bubble study and Echo cardiogram for this patient.CD

## 2017-09-20 ENCOUNTER — Ambulatory Visit (HOSPITAL_COMMUNITY)
Admission: RE | Admit: 2017-09-20 | Discharge: 2017-09-20 | Disposition: A | Discharge status: Home / Self Care | Payer: Medicaid Other | Source: Ambulatory Visit | Attending: Neurology | Admitting: Neurology

## 2017-09-20 ENCOUNTER — Telehealth: Payer: Self-pay | Admitting: Neurology

## 2017-09-20 ENCOUNTER — Other Ambulatory Visit: Payer: Self-pay | Admitting: Neurology

## 2017-09-20 DIAGNOSIS — R519 Headache, unspecified: Secondary | ICD-10-CM

## 2017-09-20 DIAGNOSIS — G43011 Migraine without aura, intractable, with status migrainosus: Secondary | ICD-10-CM | POA: Diagnosis not present

## 2017-09-20 DIAGNOSIS — R51 Headache: Secondary | ICD-10-CM | POA: Diagnosis not present

## 2017-09-20 DIAGNOSIS — R569 Unspecified convulsions: Secondary | ICD-10-CM

## 2017-09-20 NOTE — Telephone Encounter (Signed)
-----   Message from Melvyn Novasarmen Dohmeier, MD sent at 09/20/2017  1:30 PM EST ----- Normal Echo- please call patient.

## 2017-09-20 NOTE — Telephone Encounter (Signed)
Called the patient and made her aware that the Echo was normal. Reminded the pt to keep their upcoming apt in January.

## 2017-09-20 NOTE — Progress Notes (Signed)
  Echocardiogram 2D Echocardiogram has been performed.  Bethany Fleming Bethany Fleming 09/20/2017, 10:16 AM

## 2017-09-30 DIAGNOSIS — G40909 Epilepsy, unspecified, not intractable, without status epilepticus: Secondary | ICD-10-CM | POA: Insufficient documentation

## 2017-10-17 ENCOUNTER — Ambulatory Visit: Payer: Medicaid Other | Admitting: Diagnostic Neuroimaging

## 2017-10-17 ENCOUNTER — Encounter: Payer: Self-pay | Admitting: Diagnostic Neuroimaging

## 2017-10-17 VITALS — BP 124/79 | HR 72 | Ht 62.0 in | Wt 250.2 lb

## 2017-10-17 DIAGNOSIS — G40909 Epilepsy, unspecified, not intractable, without status epilepticus: Secondary | ICD-10-CM

## 2017-10-17 MED ORDER — LEVETIRACETAM 750 MG PO TABS: 750.0000 mg | ORAL_TABLET | Freq: Two times a day (BID) | ORAL | 4 refills | Status: DC

## 2017-10-17 NOTE — Patient Instructions (Signed)
-   follow up EEG  - increase levetiracetam to 750mg  twice a day  - According to [place] law, you can not drive unless you are seizure / syncope free for at least 6 months and under physician's care.   - Please maintain precautions. Do not participate in activities where a loss of awareness could harm you or someone else. No swimming alone, no tub bathing, no hot tubs, no driving, no operating motorized vehicles (cars, ATVs, motocycles, etc), lawnmowers, power tools or firearms. No standing at heights, such as rooftops, ladders or stairs. Avoid hot objects such as stoves, heaters, open fires. Wear a helmet when riding a bicycle, scooter, skateboard, etc. and avoid areas of traffic. Set your water heater to 120 degrees or less.

## 2017-10-17 NOTE — Progress Notes (Signed)
[facility]  PATIENT: Bethany Fleming DOB: [birth year]  REFERRING CLINICIAN: Brigitte Pulse, MD HISTORY FROM: patient and husband REASON FOR VISIT: follow up   HISTORICAL  CHIEF COMPLAINT:  Chief Complaint  Patient presents with  . Follow-up  . Seizures    [redacted]wkspregnant (no sz).      HISTORY OF PRESENT ILLNESS:   UPDATE (10/17/17, VRP): Since last visit, now pregnant  ([redacted]wks EGA; due date March 25, 2018). Has had a few more seizure events (Oct 2018; Nov 2018; Dec 2018), but less in duration. Staring spells, LOC, some shaking. Tolerating LEV 500mg  twice a day. No alleviating or aggravating factors.   PRIOR HPI (07/05/17): 32 year old female here for evaluation of seizures. Since 2016 patient has had 10-15 episodes of loss of consciousness with convulsions. She has had other episodes of memory lapses as well. These occur during daytime or while she is sleeping. Patient had a seizure during sleep, which patient's husband witnessed in July 2018; she then was staring, blinking and had a second seizure, before going to the emergency room, and was diagnosed with possible new onset seizure and referred to PCP. Patient was then referred to neurology as well. Patient has family history of seizure in her own mother and son. No specific triggering or aggravating factors. Last Tuesday patient had an episode where she will remembers getting into the shower, but woke up on the bottom of the shower with her dog standing over her licking her face. Patient is not sure how she passed out. She had soreness on her head and thinks she hit it when she fell down.   REVIEW OF SYSTEMS: Full 14 system review of systems performed and negative with exception of: headache passing out insomnia restless legs.   ALLERGIES: Allergies  Allergen Reactions  . Other Anaphylaxis    Watermelon, peaches  . Tomato Anaphylaxis    unknown    HOME MEDICATIONS: Outpatient Medications Prior to Visit  Medication  Sig Dispense Refill  . acetaminophen (TYLENOL) 500 MG tablet Take 2 tablets (1,000 mg total) by mouth every 6 (six) hours as needed for moderate pain. 30 tablet 0  . folic acid (FOLVITE) 1 MG tablet Take 1 mg by mouth 4 (four) times daily.    Marland Kitchen levETIRAcetam (KEPPRA) 500 MG tablet Take 1 tablet (500 mg total) by mouth 2 (two) times daily. 60 tablet 12  . ondansetron (ZOFRAN-ODT) 4 MG disintegrating tablet Take 4 mg by mouth every 8 (eight) hours as needed for nausea.     . Prenatal Vit-Fe Fumarate-FA (PRENATAL MULTIVITAMIN) TABS tablet Take 1 tablet by mouth daily at 12 noon.     No facility-administered medications prior to visit.     PAST MEDICAL HISTORY: Past Medical History:  Diagnosis Date  . Migraine   . Seizures (HCC)    most recent possibly 06/28/17    PAST SURGICAL HISTORY: Past Surgical History:  Procedure Laterality Date  . CHOLECYSTECTOMY      FAMILY HISTORY: Family History  Problem Relation Age of Onset  . Cancer Mother        breast, lung  . Diabetes Mother   . Seizures Mother   . Stroke Father   . Cancer Father        hepatic  . Cerebral palsy Sister     SOCIAL HISTORY:  Social History   Socioeconomic History  . Marital status: Married    Spouse name: [name]  . Number of children: 5  . Years of education: Not  on file  . Highest education level: Not on file  Social Needs  . Financial resource strain: Not on file  . Food insecurity - worry: Not on file  . Food insecurity - inability: Not on file  . Transportation needs - medical: Not on file  . Transportation needs - non-medical: Not on file  Occupational History    Comment: NA  Tobacco Use  . Smoking status: Former Smoker    Types: Cigarettes    Start date: 07/05/2010    Last attempt to quit: 07/05/2012    Years since quitting: 5.2  . Smokeless tobacco: Never Used  Substance and Sexual Activity  . Alcohol use: No  . Drug use: No  . Sexual activity: Yes    Birth control/protection: Pill    Other Topics Concern  . Not on file  Social History Narrative   Lives with family   Caffeine- sometimes     PHYSICAL EXAM  GENERAL EXAM/CONSTITUTIONAL: Vitals:  Vitals:   10/17/17 1601  BP: 124/79  Pulse: 72  Weight: 250 lb 3.2 oz (113.5 kg)  Height: 5\' 2"  (1.575 m)   Body mass index is 45.76 kg/m. No exam data present  Patient is in no distress; well developed, nourished and groomed; neck is supple  CARDIOVASCULAR:  Examination of carotid arteries is normal; no carotid bruits  Regular rate and rhythm, no murmurs  Examination of peripheral vascular system by observation and palpation is normal  EYES:  Ophthalmoscopic exam of optic discs and posterior segments is normal; no papilledema or hemorrhages  MUSCULOSKELETAL:  Gait, strength, tone, movements noted in Neurologic exam below  NEUROLOGIC: MENTAL STATUS:  No flowsheet data found.  awake, alert, oriented to person, place and time  recent and remote memory intact  normal attention and concentration  language fluent, comprehension intact, naming intact,   fund of knowledge appropriate  CRANIAL NERVE:   2nd - no papilledema on fundoscopic exam  2nd, 3rd, 4th, 6th - pupils equal and reactive to light, visual fields full to confrontation, extraocular muscles intact, no nystagmus  5th - facial sensation symmetric  7th - facial strength symmetric  8th - hearing intact  9th - palate elevates symmetrically, uvula midline  11th - shoulder shrug symmetric  12th - tongue protrusion midline  MOTOR:   normal bulk and tone, full strength in the BUE, BLE  SENSORY:   normal and symmetric to light touch, temperature, vibration  COORDINATION:   finger-nose-finger, fine finger movements normal  REFLEXES:   deep tendon reflexes present and symmetric  GAIT/STATION:   narrow based gait; romberg is negative    DIAGNOSTIC DATA (LABS, IMAGING, TESTING) - I reviewed patient records, labs,  notes, testing and imaging myself where available.  Lab Results  Component Value Date   WBC 7.9 08/08/2017   HGB 13.4 08/08/2017   HCT 39.6 08/08/2017   MCV 84.3 08/08/2017   PLT 231 08/08/2017   No results found for: NA, K, CL, CO2, GLUCOSE, BUN, CREATININE, CALCIUM, PROT, ALBUMIN, AST, ALT, ALKPHOS, BILITOT, GFRNONAA, GFRAA No results found for: CHOL, HDL, LDLCALC, LDLDIRECT, TRIG, CHOLHDL No results found for: UXLK4MHGBA1C No results found for: VITAMINB12 No results found for: TSH   04/25/17 CT head [I reviewed images myself and agree with interpretation. -VRP]  - no acute findings  08/14/17 MRI of the brain without contrast [I reviewed images myself. Few non-specific foci of gliosis. -VRP]  1.  Few scattered T2/FLAIR hyperintense foci in the subcortical and deep white matter. This  is a nonspecific finding and could be due to minimal chronic microvascular ischemic change or be the sequelae of chronic migraine or small cardio-embolic events.  Demyelination is unlikely. None of these appear to be acute. 2.   There are no acute findings.  09/20/17 TTE - Left ventricle: The cavity size was normal. Systolic function was   normal. The estimated ejection fraction was in the range of 55%   to 60%. Wall motion was normal; there were no regional wall   motion abnormalities. Left ventricular diastolic function   parameters were normal.    ASSESSMENT AND PLAN  32 y.o. year old female here with multiple episodes of loss of consciousness with convulsions, postictal confusion since 2016, consistent with seizure disorder.  Last seizure Oct 02, 2017.   Now [redacted]wks EGA pregnant.    Dx: seizure disorder  1. Seizure disorder (HCC)      PLAN:  - follow up EEG  - increase levetiracetam to 750mg  twice a day; seizure precautions and seizure medications in pregnancy reviewed  - According to [place] law, you can not drive unless you are seizure / syncope free for at least 6 months and under  physician's care.   - Please maintain precautions. Do not participate in activities where a loss of awareness could harm you or someone else. No swimming alone, no tub bathing, no hot tubs, no driving, no operating motorized vehicles (cars, ATVs, motocycles, etc), lawnmowers, power tools or firearms. No standing at heights, such as rooftops, ladders or stairs. Avoid hot objects such as stoves, heaters, open fires. Wear a helmet when riding a bicycle, scooter, skateboard, etc. and avoid areas of traffic. Set your water heater to 120 degrees or less.  Meds ordered this encounter  Medications  . levETIRAcetam (KEPPRA) 750 MG tablet    Sig: Take 1 tablet (750 mg total) by mouth 2 (two) times daily.    Dispense:  180 tablet    Refill:  4   Return in about 3 months (around 01/15/2018).  I reviewed images, labs, notes, records myself. I summarized findings and reviewed with patient, for this high risk condition (seizure disorder in pregnancy) requiring high complexity decision making.    Suanne Marker, MD 10/17/2017, 4:19 PM Certified in Neurology, Neurophysiology and Neuroimaging  [facility] [address], [address] [place], [place] [address] [phone]

## 2017-10-19 ENCOUNTER — Other Ambulatory Visit: Payer: Medicaid Other

## 2017-10-20 ENCOUNTER — Encounter: Payer: Self-pay | Admitting: Diagnostic Neuroimaging

## 2017-11-30 ENCOUNTER — Telehealth: Payer: Self-pay | Admitting: Diagnostic Neuroimaging

## 2017-11-30 NOTE — Telephone Encounter (Signed)
Pt called to report she had a seizure last night. She reports her husband was with her, she did bite her tongue but was unaware she had done it until she drank something this morning. She thinks she had another one a couple of days ago, she felt herself shaking, then blacked out, her husband home from an errand and found her on the couch. She said she increased keppra as instructed at last OV, she has not missed a dose. Please call to advise

## 2017-11-30 NOTE — Telephone Encounter (Signed)
pls call patient. We may need to increase LEV to 1000mg  twice a day. May also setup follow up appt if needed or jsut send in higher dose.   -VRP

## 2017-11-30 NOTE — Telephone Encounter (Signed)
Spoke with patient who denied missing any doses of Keppra. She takes it faithfully at 6:30 am, 8:30 pm daily. She stated she has never bitten her tongue before and was concerned that it was an unusual occurrence. This RN advised her that seizure patients do often bite their tongue or inside of cheek.   She stated she had no warning signs these past two times, but was unsure if the first event was a seizure. She would like the medication to be increased and keep her April FU. Her husband has to bring her and has to give 3 weeks notice to have a day off. She has no other transportation unless it is emergent This RN advised she monitor her symptoms and call for any events, seizures, questions or concerns. She can be worked in for early FU if needed. Advised will let Dr Marjory LiesPenumalli know about Keppra increase and this RN will call her back to confirm his reply. She verbalized understanding, appreciation.

## 2017-12-13 MED ORDER — LEVETIRACETAM 1000 MG PO TABS: 1000.0000 mg | ORAL_TABLET | Freq: Two times a day (BID) | ORAL | 12 refills | Status: DC

## 2017-12-13 NOTE — Telephone Encounter (Signed)
Spoke with  Patient and advised her Dr Marjory LiesPenumalli sent in new Rx for Keppra 1000 mg, take one tab twice a day. Patient asked if her pregnancy is causing her seizures. This RN advised that her body does go through changes and weight gain with pregnancy, but this RN cannot specifically answer her question.  Offered to send her question to Dr Marjory LiesPenumalli; patient stated she would talk to him during her FU.  Patient denied any further seizure activity since 11/30/17 conversation with this RN. This RN advised she call prior to May FU for any problems, concerns, questions. Patient verbalized understanding, appreciation.

## 2017-12-13 NOTE — Telephone Encounter (Signed)
Patient is returning your call.  

## 2017-12-13 NOTE — Telephone Encounter (Signed)
LVM requesting call back re: medication change.

## 2017-12-13 NOTE — Telephone Encounter (Signed)
I increased levetiracetam to 1000mg  twice a day.  Meds ordered this encounter  Medications  . levETIRAcetam (KEPPRA) 1000 MG tablet    Sig: Take 1 tablet (1,000 mg total) by mouth 2 (two) times daily.    Dispense:  60 tablet    Refill:  12   Suanne MarkerVIKRAM R. Kimmie Berggren, MD 12/13/2017, 10:41 AM Certified in Neurology, Neurophysiology and Neuroimaging  [facility] [address], [address] [place], [place] [address] [phone]

## 2017-12-13 NOTE — Addendum Note (Signed)
Addended by: Joycelyn SchmidPENUMALLI, VIKRAM R on: 12/13/2017 10:41 AM   Modules accepted: Orders

## 2018-02-03 DIAGNOSIS — Z3490 Encounter for supervision of normal pregnancy, unspecified, unspecified trimester: Secondary | ICD-10-CM | POA: Insufficient documentation

## 2018-02-20 ENCOUNTER — Ambulatory Visit: Payer: Medicaid Other | Admitting: Diagnostic Neuroimaging

## 2018-03-22 IMAGING — US US OB TRANSVAGINAL
1 series · 15 of 28 positions shown · non-contrast
Comparison: None.

CLINICAL DATA: Abdominal pain for 2 days. Beta HCG [DATE].
Gestational age by last menstrual period 8 weeks and 5 days.

EXAM:
OBSTETRIC <14 WK US AND TRANSVAGINAL OB US
TECHNIQUE: Both transabdominal and transvaginal ultrasound examinations were
performed for complete evaluation of the gestation as well as the
maternal uterus, adnexal regions, and pelvic cul-de-sac.
Transvaginal technique was performed to assess early pregnancy.

[Series 1: us ob transvaginal · 15 of 59 slices shown]
[im 1/59]
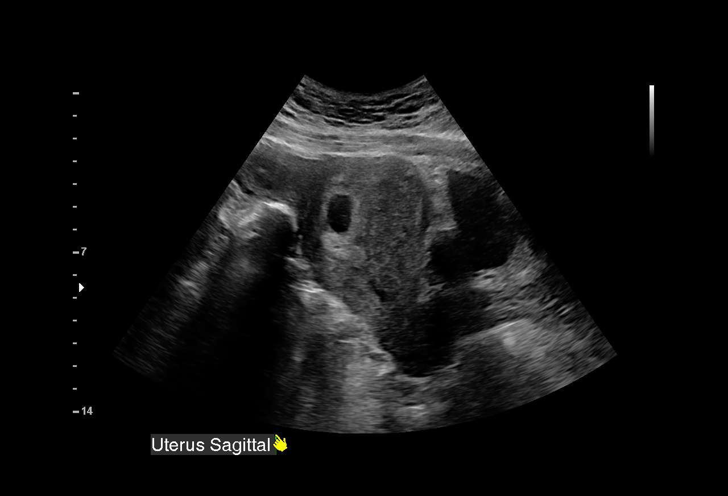
[im 5/59]
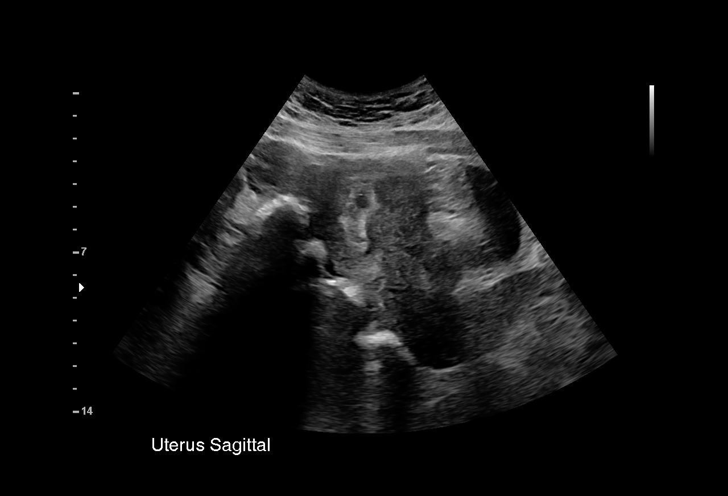
[im 9/59]
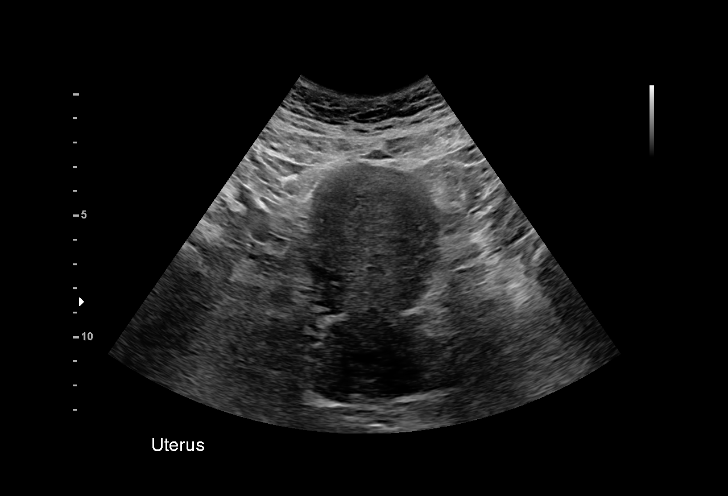
[im 13/59]
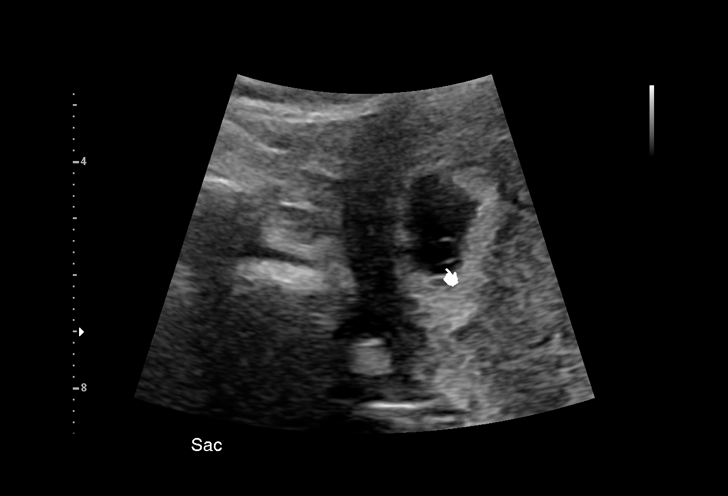
[im 18/59]
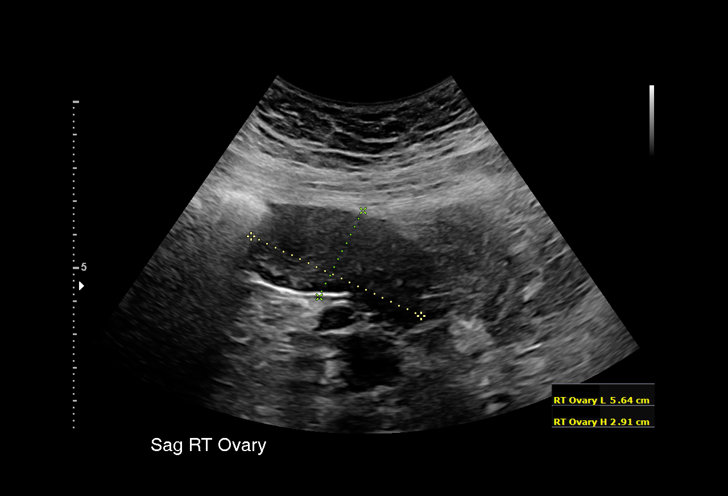
[im 22/59]
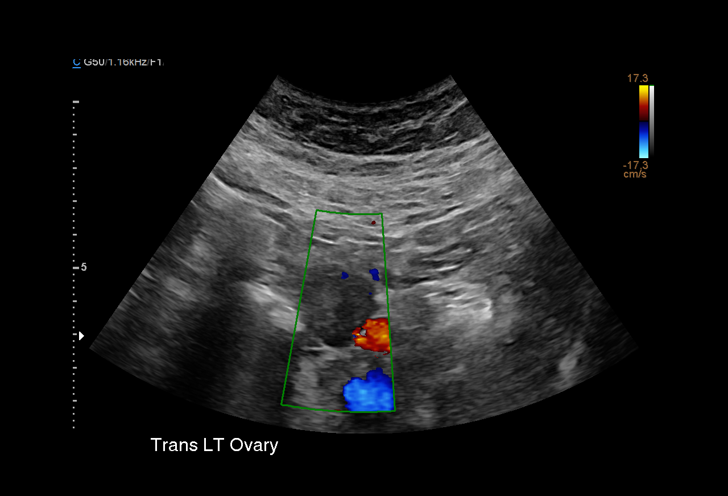
[im 26/59]
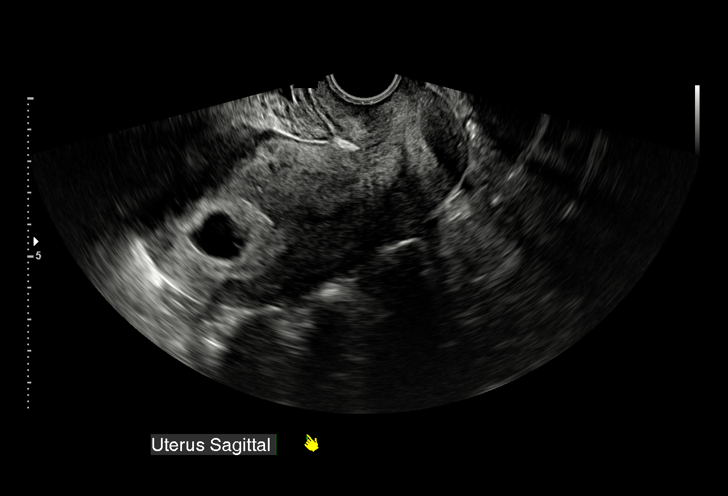
[im 31/59]
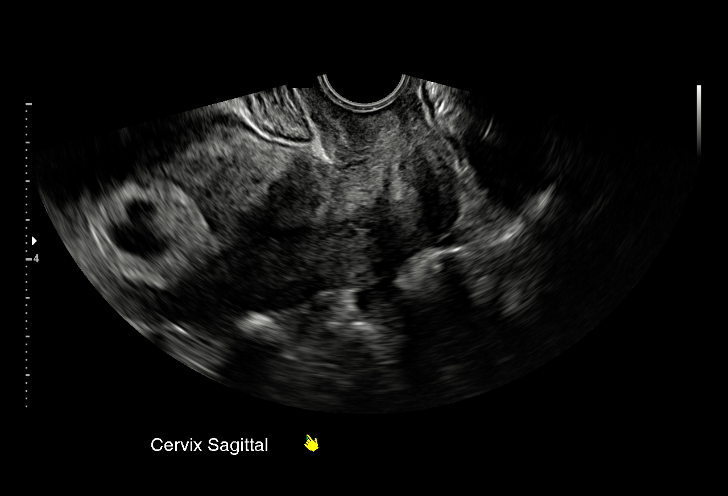
[im 33/59]
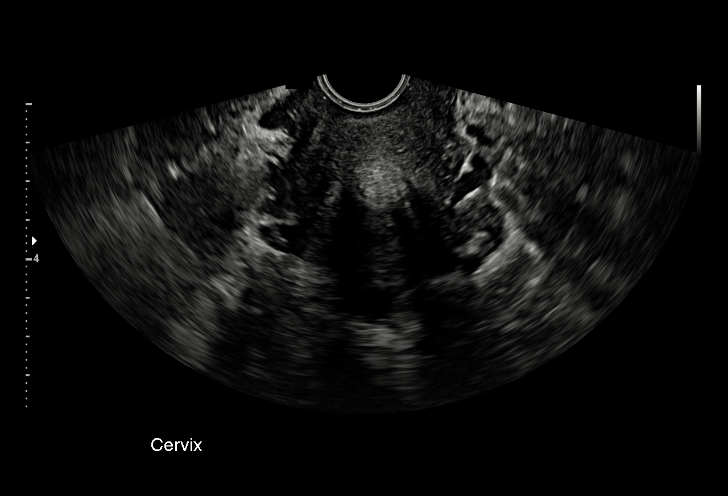
[im 37/59]
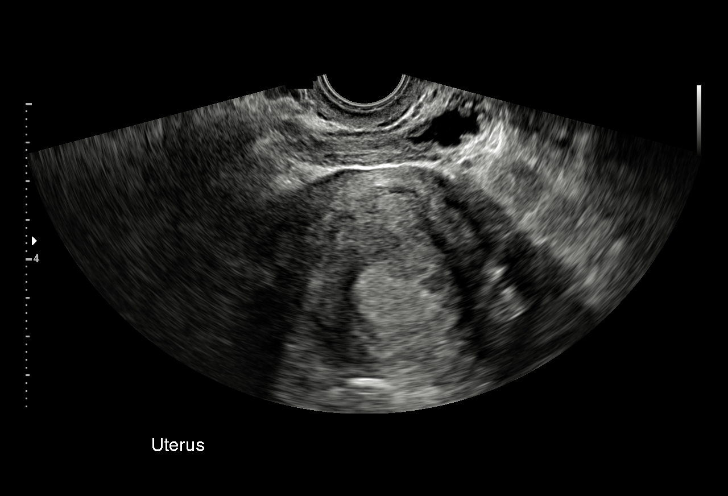
[im 41/59]
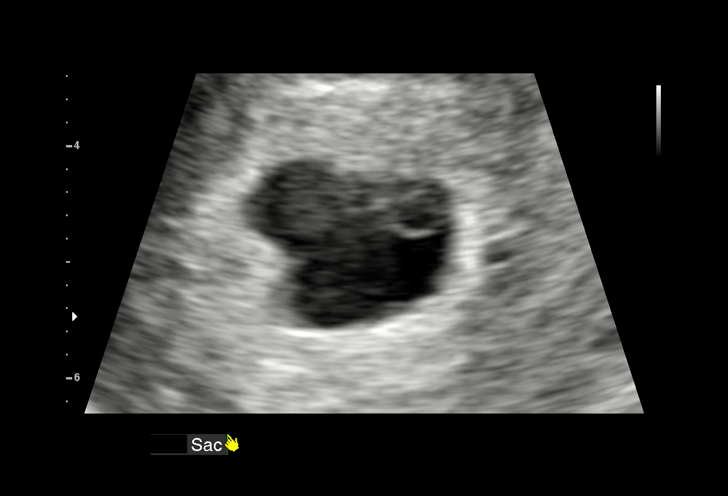
[im 46/59]
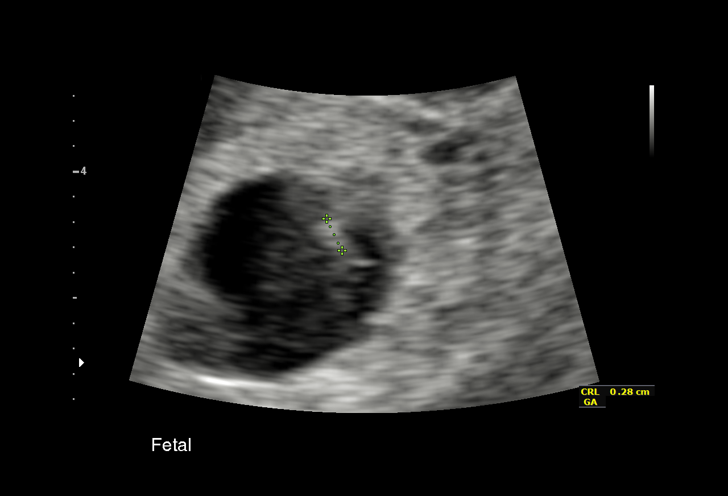
[im 50/59]
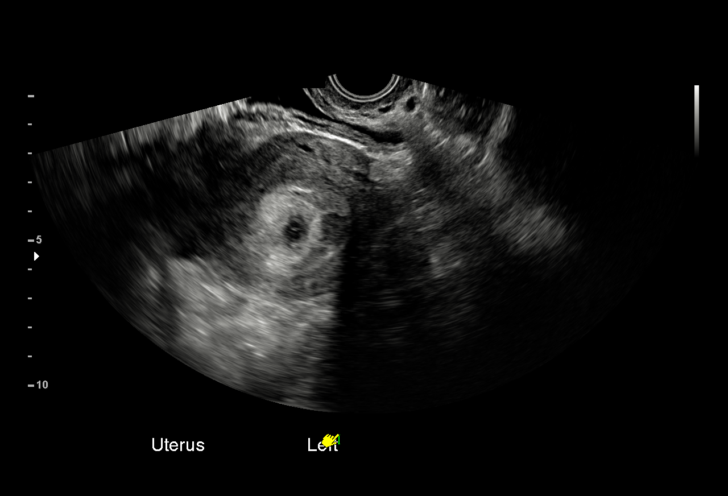
[im 54/59]
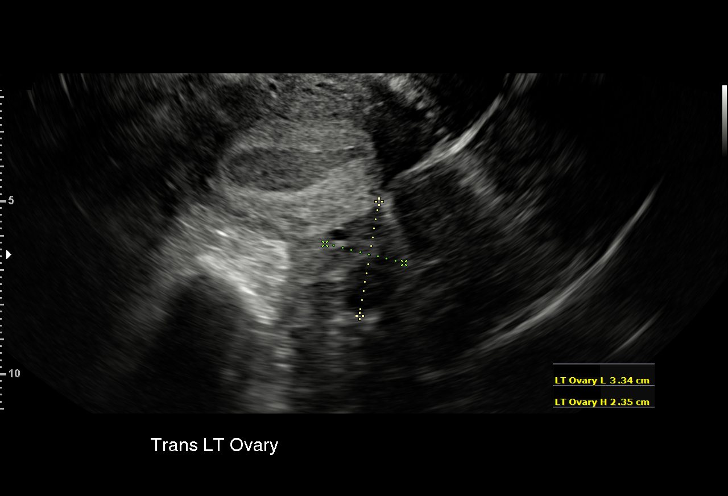
[im 59/59]
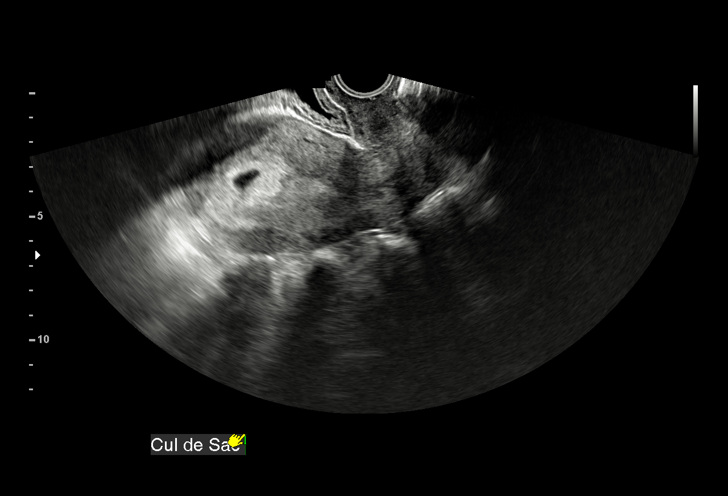

[15 of 28 positions shown; findings below may reference images not displayed]

FINDINGS: Intrauterine gestational sac: Present

Yolk sac:  Present

Embryo:  Present

Cardiac Activity: Present

Heart Rate: 111  bpm

CRL:  2.6  mm   5 w   5 d                  US EDC: April 06, 2018

Subchorionic hemorrhage:  Moderate subchorionic hemorrhage.

Maternal uterus/adnexae: 2.1 cm intra ovarian RIGHT corpus luteal
cyst. No free fluid.
IMPRESSION: 1. Single live intrauterine pregnancy, gestational age by ultrasound
5 weeks and 5 days, discordant dates.
2. Moderate subchorionic hemorrhage.

## 2018-06-29 ENCOUNTER — Encounter (HOSPITAL_COMMUNITY): Payer: Self-pay

## 2020-08-04 ENCOUNTER — Other Ambulatory Visit: Payer: Self-pay | Admitting: Legal Medicine

## 2020-08-05 ENCOUNTER — Ambulatory Visit: Payer: Medicaid Other | Admitting: Diagnostic Neuroimaging

## 2020-08-05 ENCOUNTER — Other Ambulatory Visit: Payer: Self-pay

## 2020-08-05 ENCOUNTER — Encounter: Payer: Self-pay | Admitting: Diagnostic Neuroimaging

## 2020-08-05 VITALS — BP 151/108 | HR 74 | Ht 62.0 in | Wt 250.2 lb

## 2020-08-05 DIAGNOSIS — G40909 Epilepsy, unspecified, not intractable, without status epilepticus: Secondary | ICD-10-CM | POA: Diagnosis not present

## 2020-08-05 MED ORDER — LEVETIRACETAM 1000 MG PO TABS: 1000.0000 mg | ORAL_TABLET | Freq: Two times a day (BID) | ORAL | 4 refills | Status: DC

## 2020-08-05 NOTE — Patient Instructions (Signed)
-   CONTINUE levetiracetam 1000mg  twice a day  - According to [place] law, you can not drive unless you are seizure / syncope free for at least 6 months and under physician's care.   - Please maintain precautions. Do not participate in activities where a loss of awareness could harm you or someone else. No swimming alone, no tub bathing, no hot tubs, no driving, no operating motorized vehicles (cars, ATVs, motocycles, etc), lawnmowers, power tools or firearms. No standing at heights, such as rooftops, ladders or stairs. Avoid hot objects such as stoves, heaters, open fires. Wear a helmet when riding a bicycle, scooter, skateboard, etc. and avoid areas of traffic. Set your water heater to 120 degrees or less.

## 2020-08-05 NOTE — Progress Notes (Signed)
[facility]  PATIENT: Bethany Fleming DOB: [birth year]  REFERRING CLINICIAN: Brigitte Pulse, MD HISTORY FROM: patient and husband REASON FOR VISIT: follow up   HISTORICAL  CHIEF COMPLAINT:  Chief Complaint  Patient presents with  . Seizures    rm 7, husband- [name], FU due to recurrent seizures, passing out     HISTORY OF PRESENT ILLNESS:   UPDATE (08/05/20, VRP): Since last visit, lost insurance, had reduced LEV dosing on her own x 1 year. Poor sleep and nutrition. High stress from taking care of kids and trying to work at the same time. Has had breakthrough seizures and syncope in last 1 week.   UPDATE (10/17/17, VRP): Since last visit, now pregnant  ([redacted]wks EGA; due date March 25, 2018). Has had a few more seizure events (Oct 2018; Nov 2018; Dec 2018), but less in duration. Staring spells, LOC, some shaking. Tolerating LEV 500mg  twice a day. No alleviating or aggravating factors.   PRIOR HPI (07/05/17): 34 year old female here for evaluation of seizures. Since 2016 patient has had 10-15 episodes of loss of consciousness with convulsions. She has had other episodes of memory lapses as well. These occur during daytime or while she is sleeping. Patient had a seizure during sleep, which patient's husband witnessed in July 2018; she then was staring, blinking and had a second seizure, before going to the emergency room, and was diagnosed with possible new onset seizure and referred to PCP. Patient was then referred to neurology as well. Patient has family history of seizure in her own mother and son. No specific triggering or aggravating factors. Last Tuesday patient had an episode where she will remembers getting into the shower, but woke up on the bottom of the shower with her dog standing over her licking her face. Patient is not sure how she passed out. She had soreness on her head and thinks she hit it when she fell down.   REVIEW OF SYSTEMS: Full 14 system review of systems  performed and negative with exception of: as per HPI.  ALLERGIES: Allergies  Allergen Reactions  . Other Anaphylaxis    Watermelon, peaches  . Tomato Anaphylaxis    unknown    HOME MEDICATIONS: Outpatient Medications Prior to Visit  Medication Sig Dispense Refill  . Cholecalciferol (D3 HIGH POTENCY) 50 MCG (2000 UT) CAPS Take by mouth.    . famotidine (PEPCID) 10 MG tablet Take 20 mg by mouth 2 (two) times daily. OTC    . levETIRAcetam (KEPPRA) 1000 MG tablet Take 1 tablet (1,000 mg total) by mouth 2 (two) times daily. 60 tablet 12  . LORazepam (ATIVAN) 1 MG tablet Take 0.5 mg by mouth 3 (three) times daily.    Saturday acetaminophen (TYLENOL) 500 MG tablet Take 2 tablets (1,000 mg total) by mouth every 6 (six) hours as needed for moderate pain. (Patient not taking: Reported on 08/05/2020) 30 tablet 0  . folic acid (FOLVITE) 1 MG tablet Take 1 mg by mouth 4 (four) times daily. (Patient not taking: Reported on 08/05/2020)    . ondansetron (ZOFRAN-ODT) 4 MG disintegrating tablet Take 4 mg by mouth every 8 (eight) hours as needed for nausea.  (Patient not taking: Reported on 08/05/2020)    . Prenatal Vit-Fe Fumarate-FA (PRENATAL MULTIVITAMIN) TABS tablet Take 1 tablet by mouth daily at 12 noon. (Patient not taking: Reported on 08/05/2020)     No facility-administered medications prior to visit.    PAST MEDICAL HISTORY: Past Medical History:  Diagnosis Date  .  Migraine   . Seizures (HCC)    most recent possibly 06/28/17    PAST SURGICAL HISTORY: Past Surgical History:  Procedure Laterality Date  . CHOLECYSTECTOMY    . TUBAL LIGATION      FAMILY HISTORY: Family History  Problem Relation Age of Onset  . Cancer Mother        breast, lung  . Diabetes Mother   . Seizures Mother   . Stroke Father   . Cancer Father        hepatic  . Cerebral palsy Sister     SOCIAL HISTORY:  Social History   Socioeconomic History  . Marital status: Married    Spouse name: [name]  . Number of  children: 5  . Years of education: Not on file  . Highest education level: Not on file  Occupational History    Comment: NA  Tobacco Use  . Smoking status: Former Smoker    Types: Cigarettes    Start date: 07/05/2010    Quit date: 07/05/2012    Years since quitting: 8.0  . Smokeless tobacco: Never Used  Substance and Sexual Activity  . Alcohol use: No  . Drug use: No  . Sexual activity: Yes    Birth control/protection: Pill  Other Topics Concern  . Not on file  Social History Narrative   Lives with family   Caffeine- sometimes   Social Determinants of Health   Financial Resource Strain:   . Difficulty of Paying Living Expenses: Not on file  Food Insecurity:   . Worried About Programme researcher, broadcasting/film/video in the Last Year: Not on file  . Ran Out of Food in the Last Year: Not on file  Transportation Needs:   . Lack of Transportation (Medical): Not on file  . Lack of Transportation (Non-Medical): Not on file  Physical Activity:   . Days of Exercise per Week: Not on file  . Minutes of Exercise per Session: Not on file  Stress:   . Feeling of Stress : Not on file  Social Connections:   . Frequency of Communication with Friends and Family: Not on file  . Frequency of Social Gatherings with Friends and Family: Not on file  . Attends Religious Services: Not on file  . Active Member of Clubs or Organizations: Not on file  . Attends Banker Meetings: Not on file  . Marital Status: Not on file  Intimate Partner Violence:   . Fear of Current or Ex-Partner: Not on file  . Emotionally Abused: Not on file  . Physically Abused: Not on file  . Sexually Abused: Not on file     PHYSICAL EXAM  GENERAL EXAM/CONSTITUTIONAL: Vitals:  Vitals:   08/05/20 0926  BP: (!) 151/108  Pulse: 74  Weight: 250 lb 3.2 oz (113.5 kg)  Height: 5\' 2"  (1.575 m)   Body mass index is 45.76 kg/m. No exam data present  Patient is in no distress; well developed, nourished and groomed; neck is  supple  CARDIOVASCULAR:  Examination of carotid arteries is normal; no carotid bruits  Regular rate and rhythm, no murmurs  Examination of peripheral vascular system by observation and palpation is normal  EYES:  Ophthalmoscopic exam of optic discs and posterior segments is normal; no papilledema or hemorrhages  MUSCULOSKELETAL:  Gait, strength, tone, movements noted in Neurologic exam below  NEUROLOGIC: MENTAL STATUS:  No flowsheet data found.  awake, alert, oriented to person, place and time  recent and remote memory intact  normal  attention and concentration  language fluent, comprehension intact, naming intact,   fund of knowledge appropriate  CRANIAL NERVE:   2nd - no papilledema on fundoscopic exam  2nd, 3rd, 4th, 6th - pupils equal and reactive to light, visual fields full to confrontation, extraocular muscles intact, no nystagmus  5th - facial sensation symmetric  7th - facial strength symmetric  8th - hearing intact  9th - palate elevates symmetrically, uvula midline  11th - shoulder shrug symmetric  12th - tongue protrusion midline  MOTOR:   normal bulk and tone, full strength in the BUE, BLE  SENSORY:   normal and symmetric to light touch, temperature, vibration  COORDINATION:   finger-nose-finger, fine finger movements normal  REFLEXES:   deep tendon reflexes present and symmetric  GAIT/STATION:   narrow based gait    DIAGNOSTIC DATA (LABS, IMAGING, TESTING) - I reviewed patient records, labs, notes, testing and imaging myself where available.  Lab Results  Component Value Date   WBC 7.9 08/08/2017   HGB 13.4 08/08/2017   HCT 39.6 08/08/2017   MCV 84.3 08/08/2017   PLT 231 08/08/2017   No results found for: NA, K, CL, CO2, GLUCOSE, BUN, CREATININE, CALCIUM, PROT, ALBUMIN, AST, ALT, ALKPHOS, BILITOT, GFRNONAA, GFRAA No results found for: CHOL, HDL, LDLCALC, LDLDIRECT, TRIG, CHOLHDL No results found for: NWGN5A No results  found for: VITAMINB12 No results found for: TSH   04/25/17 CT head [I reviewed images myself and agree with interpretation. -VRP]  - no acute findings  08/14/17 MRI of the brain without contrast [I reviewed images myself. Few non-specific foci of gliosis. -VRP]  1.  Few scattered T2/FLAIR hyperintense foci in the subcortical and deep white matter. This is a nonspecific finding and could be due to minimal chronic microvascular ischemic change or be the sequelae of chronic migraine or small cardio-embolic events.  Demyelination is unlikely. None of these appear to be acute. 2.   There are no acute findings.  09/20/17 TTE - Left ventricle: The cavity size was normal. Systolic function was   normal. The estimated ejection fraction was in the range of 55%   to 60%. Wall motion was normal; there were no regional wall   motion abnormalities. Left ventricular diastolic function   parameters were normal.    ASSESSMENT AND PLAN  34 y.o. year old female here with multiple episodes of loss of consciousness with convulsions, postictal confusion since 2016, consistent with seizure disorder.  Last seizures Oct 02, 2017 and 08/01/20.  Dx: seizure disorder  1. Seizure disorder (HCC)     PLAN:  - CONTINUE levetiracetam 1000mg  twice a day  - According to [place] law, you can not drive unless you are seizure / syncope free for at least 6 months and under physician's care.   - Please maintain precautions. Do not participate in activities where a loss of awareness could harm you or someone else. No swimming alone, no tub bathing, no hot tubs, no driving, no operating motorized vehicles (cars, ATVs, motocycles, etc), lawnmowers, power tools or firearms. No standing at heights, such as rooftops, ladders or stairs. Avoid hot objects such as stoves, heaters, open fires. Wear a helmet when riding a bicycle, scooter, skateboard, etc. and avoid areas of traffic. Set your water heater to 120 degrees or  less.  Meds ordered this encounter  Medications  . levETIRAcetam (KEPPRA) 1000 MG tablet    Sig: Take 1 tablet (1,000 mg total) by mouth 2 (two) times daily.    Dispense:  180 tablet    Refill:  4   Return in about 1 year (around 08/05/2021) for with NP (Amy Lomax), virtual visit (15 min).  I reviewed images, labs, notes, records myself. I summarized findings and reviewed with patient, for this high risk condition (seizure disorder) requiring high complexity decision making.    Suanne MarkerVIKRAM R. Tenita Cue, MD 08/05/2020, 9:43 AM Certified in Neurology, Neurophysiology and Neuroimaging  [facility] [address], [address] [place], [place] [address] [phone]

## 2020-08-06 DIAGNOSIS — F419 Anxiety disorder, unspecified: Secondary | ICD-10-CM | POA: Insufficient documentation

## 2020-08-06 DIAGNOSIS — R55 Syncope and collapse: Secondary | ICD-10-CM | POA: Insufficient documentation

## 2020-08-06 DIAGNOSIS — I10 Essential (primary) hypertension: Secondary | ICD-10-CM | POA: Insufficient documentation

## 2020-10-14 ENCOUNTER — Telehealth: Payer: Self-pay | Admitting: Diagnostic Neuroimaging

## 2020-10-14 MED ORDER — LEVETIRACETAM 750 MG PO TABS: 1500.0000 mg | ORAL_TABLET | Freq: Two times a day (BID) | ORAL | 4 refills | Status: DC

## 2020-10-14 NOTE — Telephone Encounter (Signed)
Pt called wanting to discuss her levETIRAcetam (KEPPRA) 1000 MG tablet with RN. Pt states she is still having seizures and is wanting to know if this medication should be increased or changed. Please advise.

## 2020-10-14 NOTE — Telephone Encounter (Signed)
Called patient and informed her of new Rx Dr Marjory Lies sent in for seizures. Advised she can come in for FU to discuss migraines, headaches. She asked to come in; appointment scheduled. Patient verbalized understanding, appreciation.

## 2020-10-14 NOTE — Telephone Encounter (Signed)
Increase levetiracetam to 1500mg  twice a day.   May setup follow up appt to discuss headaches.   Meds ordered this encounter  Medications  . levETIRAcetam (KEPPRA) 750 MG tablet    Sig: Take 2 tablets (1,500 mg total) by mouth 2 (two) times daily.    Dispense:  360 tablet    Refill:  4    , MD 10/14/2020, 4:56 PM Certified in Neurology, Neurophysiology and Neuroimaging  [facility] [address] [place], [place] [phone]

## 2020-10-14 NOTE — Telephone Encounter (Signed)
Called patient who stated lately she's been getting bad migraines that last 2-3 days. She has taken ES tylenol without relief. She said she is now having 2-4 seizures a week,  thinks they may be due to her bad migraines. PCP advised she call her neurologist. I advised will let Dr Marjory Lies know of her concerns and call her with his recommendations. Patient verbalized understanding, appreciation.

## 2020-11-11 ENCOUNTER — Encounter: Payer: Self-pay | Admitting: Diagnostic Neuroimaging

## 2020-11-11 ENCOUNTER — Telehealth: Payer: Self-pay | Admitting: *Deleted

## 2020-11-11 ENCOUNTER — Ambulatory Visit: Payer: Self-pay | Admitting: Diagnostic Neuroimaging

## 2020-11-11 NOTE — Telephone Encounter (Signed)
Patient was no show for follow up today. 

## 2020-11-17 ENCOUNTER — Telehealth: Payer: Self-pay | Admitting: Diagnostic Neuroimaging

## 2020-11-17 NOTE — Telephone Encounter (Signed)
Called patient, LVM requesting call back to discuss.   

## 2020-11-17 NOTE — Telephone Encounter (Signed)
Pt states when she takes her Keppra in the morning she is fine but in the evening she is left feeling dizzy and light headed, pt asking for a call to discuss.

## 2020-11-18 ENCOUNTER — Other Ambulatory Visit: Payer: Self-pay | Admitting: Neurology

## 2020-11-18 ENCOUNTER — Telehealth: Payer: Self-pay | Admitting: Neurology

## 2020-11-18 DIAGNOSIS — R519 Headache, unspecified: Secondary | ICD-10-CM

## 2020-11-18 DIAGNOSIS — G40909 Epilepsy, unspecified, not intractable, without status epilepticus: Secondary | ICD-10-CM

## 2020-11-18 NOTE — Telephone Encounter (Signed)
Patient paged on call doctor, I was driving home and called her back while enroute; patient said she had 2 seizures today, "medication is not working". She said she is taking 1750mg  twice daily (1000mg  pill and 750mg  pill). Also said she may have been taking topiramate but ran out a month ago. I reviewed chart when I arrived home, I don't see any topiramate. and the most recent medication Dr. prescribed was Keppra 1500mg  bid(750mg  x 2 bid). Of note, patient has called recently about increased seizures, but she no-showed her most recent appointment 2/8. I called back to try and get more information, went straight to voice mail, I finally spoke to patient again, she then reported differently that she has been taking the 1000mg  + 2x750mg  twice daily(so 2500mg  twice daily). I'm not confident she is a good historian or even knows what she is taking. She did not know the dose of topiramate or if she was even taking topiramate(she said maybe 60mg ) and she is not home and doesn't have the bottle . I told her we would need to have Dr. team call her in the morning, this may need to be reconciled in the office with all her pill bottles. In the meantime if she has a seizure that is unusually long for her or concerning, she will have to call 911. Her "seizures" include tinging of the head, some brief loss of awareness without any post-ictal symptoms. Advised her not to drive until 6 months seizure free, she is with her mother and daughter this evening who will keep an eye on her until she sees Dr. Marjory Lies.

## 2020-11-19 ENCOUNTER — Telehealth: Payer: Self-pay | Admitting: Diagnostic Neuroimaging

## 2020-11-19 NOTE — Telephone Encounter (Addendum)
Husband called back, on DPR,  for clarification of keppra.  He stated she started 750 mg, two tabs twice a day today under his direction.Marland Kitchen He stated she used to take something for headaches, name of medicine unknown. They lost insurance so she stopped it. He asked that I also call him with Dr Richrd Humbles instructions. I advised will. He verbalized understanding, appreciation.

## 2020-11-19 NOTE — Telephone Encounter (Signed)
For increased seizures and headaches, will check MRI brain, EEG and setup follow up appt.   Orders Placed This Encounter  Procedures  . MR BRAIN W WO CONTRAST  . EEG adult    Suanne Marker, MD 11/19/2020, 10:24 AM Certified in Neurology, Neurophysiology and Neuroimaging  [facility] [address], [address] [place], [place] [address] [phone]

## 2020-11-19 NOTE — Telephone Encounter (Addendum)
Called patient and informed her of Dr Marjory Lies 's new orders. She stated that she is tired, may not remember. I advised her that her husband had called and I'll be calling him with this information as well. We scheduled EEG for soonest, 12/15/20. She asked about medicine for headache. I advised she alternate tylenol, ibuprofen every 4 hours. Patient verbalized understanding, appreciation. Called husband and gave him information for MRI, EEG , gave him # for [facility]. Advised he call for any questions, problems before her FU 11/26/20. He verbalized understanding, appreciation.

## 2020-11-19 NOTE — Telephone Encounter (Signed)
[facility]cd [employer] community auth: [id number] (exp. 11/19/20 to 01/03/21) order sent to GI. They will reach out to the patient to schedule.

## 2020-11-19 NOTE — Telephone Encounter (Addendum)
Called patient who stated she has been staying with sister-in-law who was just diagnosed with seizures. Patient states she missed last FU due to having Covid. She stated she misunderstood instructions for taking Keppra. She has been taking 1000 mg plus 750 mg twice daily since 10/14/20. She apologized for her error. She stated she still thinks migraines are causing seizures, has migraine almost daily and her husband tells her she has a seizure. She describes it as tingling in her head,  and then she knows she'll have a seizure.   I advised she take Keppra as prescribed, two o750 mg tablets twice daily. Moved her FU sooner. She asked what she can take for migraines before she is seen. I advised will ask MD and let her know. Patient verbalized understanding, appreciation.

## 2020-11-19 NOTE — Addendum Note (Signed)
Addended by: Joycelyn Schmid R on: 11/19/2020 10:24 AM   Modules accepted: Orders

## 2020-11-24 ENCOUNTER — Telehealth: Payer: Self-pay | Admitting: Diagnostic Neuroimaging

## 2020-11-24 NOTE — Telephone Encounter (Signed)
Keep follow up appt. -VRP

## 2020-11-24 NOTE — Telephone Encounter (Signed)
Called husband and LVM, advised they keep follow up on Wed to discuss seizures and medication. Left # for questions, advised they call if needed before Wed.

## 2020-11-24 NOTE — Telephone Encounter (Signed)
Called patient who stated she was trying to go to bathroom last night because she felt nauseated, head felt like it was on fire. She lost control of bowels, went back to bed. She was unable to give more details. I advised will call her husband; Patient verbalized understanding, appreciation. Called [name] who stated no meds missed, did vomit about one hour  after 1st seizure and had 2 after that. No fever or other symptoms. I advised will call him back after speaking with Dr Marjory Lies. She has FU on Wed. He verbalized understanding, appreciation.

## 2020-11-24 NOTE — Telephone Encounter (Signed)
Pt called, last night at 12 am got up to use the bathroom, head felt like it was on fire, started feeling nausea, then has a seizure, lost control of my bowels. Did not go to the ER. Would like a call from the nurse.

## 2020-11-26 ENCOUNTER — Ambulatory Visit: Payer: Medicaid Other | Admitting: Diagnostic Neuroimaging

## 2020-11-26 ENCOUNTER — Telehealth: Payer: Self-pay | Admitting: *Deleted

## 2020-11-26 ENCOUNTER — Encounter: Payer: Self-pay | Admitting: Diagnostic Neuroimaging

## 2020-11-26 VITALS — BP 114/83 | HR 78 | Ht 62.0 in | Wt 251.0 lb

## 2020-11-26 DIAGNOSIS — G40909 Epilepsy, unspecified, not intractable, without status epilepticus: Secondary | ICD-10-CM

## 2020-11-26 DIAGNOSIS — G43109 Migraine with aura, not intractable, without status migrainosus: Secondary | ICD-10-CM | POA: Diagnosis not present

## 2020-11-26 DIAGNOSIS — F419 Anxiety disorder, unspecified: Secondary | ICD-10-CM | POA: Diagnosis not present

## 2020-11-26 DIAGNOSIS — F4310 Post-traumatic stress disorder, unspecified: Secondary | ICD-10-CM | POA: Diagnosis not present

## 2020-11-26 MED ORDER — ALPRAZOLAM 0.5 MG PO TABS: ORAL_TABLET | ORAL | 0 refills | Status: DC

## 2020-11-26 MED ORDER — LEVETIRACETAM 750 MG PO TABS: 1500.0000 mg | ORAL_TABLET | Freq: Two times a day (BID) | ORAL | 4 refills | Status: DC

## 2020-11-26 MED ORDER — NURTEC 75 MG PO TBDP: 75.0000 mg | ORAL_TABLET | Freq: Every day | ORAL | 6 refills | Status: DC | PRN

## 2020-11-26 MED ORDER — TOPIRAMATE 50 MG PO TABS: 50.0000 mg | ORAL_TABLET | Freq: Two times a day (BID) | ORAL | 12 refills | Status: DC

## 2020-11-26 NOTE — Telephone Encounter (Signed)
[employer] PA, key: [id number], [id number], cannot take triptans due to seizure disorder.  Your information has been sent to [employer].

## 2020-11-26 NOTE — Progress Notes (Signed)
[facility]  PATIENT: Bethany Fleming DOB: 1986-07-01  REFERRING CLINICIAN: Brigitte PulseL Perry, MD HISTORY FROM: patient and husband REASON FOR VISIT: follow up   HISTORICAL  CHIEF COMPLAINT:  Chief Complaint  Patient presents with  . Seizures    Rm 7, husbandWindy [place]- [place], [place] for [place], discuss migraine meds "last seizure was Monday night"  . Migraine    HISTORY OF PRESENT ILLNESS:   UPDATE (11/26/20, VRP): Since last visit, under high stress for last 2-3 weeks. Was helping a sister in law, by going to her house to stay with her. Another sister in law objected and call DSS and filed charges against patient. Since then, patient has had more spells and seizures. Also had COVID in Jan 2022. Last seizure like event 11/24/20. Also having more headaches (global, nausea, sens to light, seeing spots; 4 / week)  UPDATE (08/05/20, VRP): Since last visit, lost insurance, had reduced LEV dosing on her own x 1 year. Poor sleep and nutrition. High stress from taking care of kids and trying to work at the same time. Has had breakthrough seizures and syncope in last 1 week.   UPDATE (10/17/17, VRP): Since last visit, now pregnant  ([redacted]wks EGA; due date March 25, 2018). Has had a few more seizure events (Oct 2018; Nov 2018; Dec 2018), but less in duration. Staring spells, LOC, some shaking. Tolerating LEV 500mg  twice a day. No alleviating or aggravating factors.   PRIOR HPI (07/05/17): 35 year old female here for evaluation of seizures. Since 2016 patient has had 10-15 episodes of loss of consciousness with convulsions. She has had other episodes of memory lapses as well. These occur during daytime or while she is sleeping. Patient had a seizure during sleep, which patient's husband witnessed in July 2018; she then was staring, blinking and had a second seizure, before going to the emergency room, and was diagnosed with possible new onset seizure and referred to PCP. Patient was then referred to  neurology as well. Patient has family history of seizure in her own mother and son. No specific triggering or aggravating factors. Last Tuesday patient had an episode where she will remembers getting into the shower, but woke up on the bottom of the shower with her dog standing over her licking her face. Patient is not sure how she passed out. She had soreness on her head and thinks she hit it when she fell down.   REVIEW OF SYSTEMS: Full 14 system review of systems performed and negative with exception of: as per HPI.  ALLERGIES: Allergies  Allergen Reactions  . Other Anaphylaxis    Watermelon, peaches  . Tomato Anaphylaxis    unknown    HOME MEDICATIONS: Outpatient Medications Prior to Visit  Medication Sig Dispense Refill  . famotidine (PEPCID) 10 MG tablet Take 20 mg by mouth 2 (two) times daily. OTC    . ondansetron (ZOFRAN-ODT) 4 MG disintegrating tablet Take 4 mg by mouth every 8 (eight) hours as needed for nausea.    . levETIRAcetam (KEPPRA) 750 MG tablet Take 2 tablets (1,500 mg total) by mouth 2 (two) times daily. 360 tablet 4  . acetaminophen (TYLENOL) 500 MG tablet Take 2 tablets (1,000 mg total) by mouth every 6 (six) hours as needed for moderate pain. (Patient not taking: No sig reported) 30 tablet 0  . Cholecalciferol (D3 HIGH POTENCY) 50 MCG (2000 UT) CAPS Take by mouth. (Patient not taking: Reported on 11/26/2020)    . folic acid (FOLVITE) 1 MG tablet Take 1  mg by mouth 4 (four) times daily. (Patient not taking: No sig reported)    . LORazepam (ATIVAN) 1 MG tablet Take 0.5 mg by mouth 3 (three) times daily. (Patient not taking: Reported on 11/26/2020)    . Prenatal Vit-Fe Fumarate-FA (PRENATAL MULTIVITAMIN) TABS tablet Take 1 tablet by mouth daily at 12 noon. (Patient not taking: No sig reported)     No facility-administered medications prior to visit.    PAST MEDICAL HISTORY: Past Medical History:  Diagnosis Date  . Migraine   . Seizures (HCC)    most recent possibly  06/28/17    PAST SURGICAL HISTORY: Past Surgical History:  Procedure Laterality Date  . CHOLECYSTECTOMY    . TUBAL LIGATION      FAMILY HISTORY: Family History  Problem Relation Age of Onset  . Cancer Mother        breast, lung  . Diabetes Mother   . Seizures Mother   . Stroke Father   . Cancer Father        hepatic  . Cerebral palsy Sister     SOCIAL HISTORY:  Social History   Socioeconomic History  . Marital status: Married    Spouse name: Bethany Fleming  . Number of children: 5  . Years of education: Not on file  . Highest education level: Not on file  Occupational History    Comment: NA  Tobacco Use  . Smoking status: Former Smoker    Types: Cigarettes    Start date: 07/05/2010    Quit date: 07/05/2012    Years since quitting: 8.4  . Smokeless tobacco: Never Used  Substance and Sexual Activity  . Alcohol use: No  . Drug use: No  . Sexual activity: Yes    Birth control/protection: Pill  Other Topics Concern  . Not on file  Social History Narrative   Lives with family   Caffeine- sometimes   Social Determinants of Health   Financial Resource Strain: Not on file  Food Insecurity: Not on file  Transportation Needs: Not on file  Physical Activity: Not on file  Stress: Not on file  Social Connections: Not on file  Intimate Partner Violence: Not on file     PHYSICAL EXAM  GENERAL EXAM/CONSTITUTIONAL: Vitals:  Vitals:   11/26/20 1436  BP: 114/83  Pulse: 78  Weight: 251 lb (113.9 kg)  Height: 5\' 2"  (1.575 m)   Body mass index is 45.91 kg/m. No exam data present  Patient is in no distress; well developed, nourished and groomed; neck is supple  CARDIOVASCULAR:  Examination of carotid arteries is normal; no carotid bruits  Regular rate and rhythm, no murmurs  Examination of peripheral vascular system by observation and palpation is normal  EYES:  Ophthalmoscopic exam of optic discs and posterior segments is normal; no papilledema or  hemorrhages  MUSCULOSKELETAL:  Gait, strength, tone, movements noted in Neurologic exam below  NEUROLOGIC: MENTAL STATUS:  No flowsheet data found.  awake, alert, oriented to person, place and time  recent and remote memory intact  normal attention and concentration  language fluent, comprehension intact, naming intact,   fund of knowledge appropriate  CRANIAL NERVE:   2nd - no papilledema on fundoscopic exam  2nd, 3rd, 4th, 6th - pupils equal and reactive to light, visual fields full to confrontation, extraocular muscles intact, no nystagmus  5th - facial sensation symmetric  7th - facial strength symmetric  8th - hearing intact  9th - palate elevates symmetrically, uvula midline  11th - shoulder  shrug symmetric  12th - tongue protrusion midline  MOTOR:   normal bulk and tone, full strength in the BUE, BLE  SENSORY:   normal and symmetric to light touch, temperature, vibration  COORDINATION:   finger-nose-finger, fine finger movements normal  REFLEXES:   deep tendon reflexes present and symmetric  GAIT/STATION:   narrow based gait    DIAGNOSTIC DATA (LABS, IMAGING, TESTING) - I reviewed patient records, labs, notes, testing and imaging myself where available.  Lab Results  Component Value Date   WBC 7.9 08/08/2017   HGB 13.4 08/08/2017   HCT 39.6 08/08/2017   MCV 84.3 08/08/2017   PLT 231 08/08/2017   No results found for: NA, K, CL, CO2, GLUCOSE, BUN, CREATININE, CALCIUM, PROT, ALBUMIN, AST, ALT, ALKPHOS, BILITOT, GFRNONAA, GFRAA No results found for: CHOL, HDL, LDLCALC, LDLDIRECT, TRIG, CHOLHDL No results found for: MHDQ2I No results found for: VITAMINB12 No results found for: TSH   04/25/17 CT head [I reviewed images myself and agree with interpretation. -VRP]  - no acute findings  08/14/17 MRI of the brain without contrast [I reviewed images myself. Few non-specific foci of gliosis. -VRP]  1.  Few scattered T2/FLAIR hyperintense  foci in the subcortical and deep white matter. This is a nonspecific finding and could be due to minimal chronic microvascular ischemic change or be the sequelae of chronic migraine or small cardio-embolic events.  Demyelination is unlikely. None of these appear to be acute. 2.   There are no acute findings.  09/20/17 TTE - Left ventricle: The cavity size was normal. Systolic function was   normal. The estimated ejection fraction was in the range of 55%   to 60%. Wall motion was normal; there were no regional wall   motion abnormalities. Left ventricular diastolic function   parameters were normal.    ASSESSMENT AND PLAN  35 y.o. year old female here with multiple episodes of loss of consciousness with convulsions, postictal confusion since 2016, consistent with seizure disorder.  Last seizures 10/02/17, 08/01/20, 11/24/20  Dx: seizure disorder  1. Seizure disorder (HCC)   2. Migraine with aura and without status migrainosus, not intractable   3. Anxiety   4. PTSD (post-traumatic stress disorder)        PLAN:   ABNORMAL SPELLS (seizures vs syncope vs pseudoseizures) --> last spell 11/24/20  - CONTINUE levetiracetam 1500mg  twice a day  - follow up MRI and EEG; may need VEEG monitoring  - According to [place] law, you can not drive unless you are seizure / syncope free for at least 6 months and under physician's care.   - Please maintain precautions. Do not participate in activities where a loss of awareness could harm you or someone else. No swimming alone, no tub bathing, no hot tubs, no driving, no operating motorized vehicles (cars, ATVs, motocycles, etc), lawnmowers, power tools or firearms. No standing at heights, such as rooftops, ladders or stairs. Avoid hot objects such as stoves, heaters, open fires. Wear a helmet when riding a bicycle, scooter, skateboard, etc. and avoid areas of traffic. Set your water heater to 120 degrees or less.   MIGRAINE WITH AURA - start  topiramate 50mg  at bedtime; then increase to twice a day after 1-2 weeks - start nurtec as needed for breakthrough headaches    Meds ordered this encounter  Medications  . topiramate (TOPAMAX) 50 MG tablet    Sig: Take 1 tablet (50 mg total) by mouth 2 (two) times daily.    Dispense:  60 tablet    Refill:  12  . Rimegepant Sulfate (NURTEC) 75 MG TBDP    Sig: Take 75 mg by mouth daily as needed.    Dispense:  8 tablet    Refill:  6  . levETIRAcetam (KEPPRA) 750 MG tablet    Sig: Take 2 tablets (1,500 mg total) by mouth 2 (two) times daily.    Dispense:  360 tablet    Refill:  4   Return in about 6 months (around 05/26/2021) for with NP (Amy Lomax).  I reviewed images, labs, notes, records myself. I summarized findings and reviewed with patient, for this high risk condition (seizure disorder) requiring high complexity decision making.    Suanne Marker, MD 11/26/2020, 3:43 PM Certified in Neurology, Neurophysiology and Neuroimaging  [facility] [address] [place], [place] [address] [phone]

## 2020-11-26 NOTE — Patient Instructions (Signed)
ABNORMAL SPELLS (seizures vs syncope vs pseudoseizures) --> last spell 11/24/20  - CONTINUE levetiracetam 1500mg  twice a day  - follow up MRI and EEG; may need VEEG monitoring  - According to [place] law, you can not drive unless you are seizure / syncope free for at least 6 months and under physician's care.   - Please maintain precautions. Do not participate in activities where a loss of awareness could harm you or someone else. No swimming alone, no tub bathing, no hot tubs, no driving, no operating motorized vehicles (cars, ATVs, motocycles, etc), lawnmowers, power tools or firearms. No standing at heights, such as rooftops, ladders or stairs. Avoid hot objects such as stoves, heaters, open fires. Wear a helmet when riding a bicycle, scooter, skateboard, etc. and avoid areas of traffic. Set your water heater to 120 degrees or less.   MIGRAINE WITH AURA - start topiramate 50mg  at bedtime; then increase to twice a day after 1-2 weeks - start nurtec as needed for breakthrough headaches   STRESS / ANXIETY - refer to psychiatry / psychology

## 2020-11-27 NOTE — Telephone Encounter (Signed)
Nurtec denied, will do new PA, key: [id number]. Your information has been sent to [employer].

## 2020-11-27 NOTE — Telephone Encounter (Addendum)
Nurtec approved through 11/27/2021. Called husband LVM and make him aware Nurtec Rx can be filled. It is to be taken once a day as needed for headache. Left # for questions. Faxed approval letter to pharmacy.

## 2020-12-03 ENCOUNTER — Other Ambulatory Visit: Payer: Self-pay

## 2020-12-03 ENCOUNTER — Ambulatory Visit
Admission: RE | Admit: 2020-12-03 | Discharge: 2020-12-03 | Disposition: A | Discharge status: Home / Self Care | Payer: Medicaid Other | Source: Ambulatory Visit | Attending: Diagnostic Neuroimaging | Admitting: Diagnostic Neuroimaging

## 2020-12-03 DIAGNOSIS — G40909 Epilepsy, unspecified, not intractable, without status epilepticus: Secondary | ICD-10-CM | POA: Diagnosis not present

## 2020-12-03 DIAGNOSIS — R519 Headache, unspecified: Secondary | ICD-10-CM

## 2020-12-03 MED ORDER — GADOBENATE DIMEGLUMINE 529 MG/ML IV SOLN
20.0000 mL | Freq: Once | INTRAVENOUS | Status: AC | PRN
  Administered 2020-12-03: 20 mL via INTRAVENOUS

## 2020-12-08 ENCOUNTER — Telehealth: Payer: Self-pay | Admitting: *Deleted

## 2020-12-08 ENCOUNTER — Ambulatory Visit: Payer: Medicaid Other | Admitting: Diagnostic Neuroimaging

## 2020-12-08 NOTE — Telephone Encounter (Signed)
Spoke with patient and informed her MRI brain showed unremarkable imaging results. She then stated she went to the beach last weekend and had an episode on Sat where she took hot shower and thinks she passed out. She hit her head on bed , was alone but stated she didn't think it was a seizure. She didn't go to hospital. Now she reports intermittent left side numbness,  tingling, right arm tingling.  She has not missed any medications, stated she is tolerating medications well. I advised will let Dr Marjory Lies know, will call her with reply. Patient verbalized understanding, appreciation.

## 2020-12-09 NOTE — Telephone Encounter (Signed)
Per Dr Marjory Lies, LVM informing patient to monitor her symptoms, call for any concerns or worsening.

## 2020-12-15 ENCOUNTER — Other Ambulatory Visit: Payer: Self-pay

## 2020-12-23 ENCOUNTER — Telehealth: Payer: Self-pay | Admitting: Diagnostic Neuroimaging

## 2020-12-23 ENCOUNTER — Telehealth: Payer: Self-pay | Admitting: *Deleted

## 2020-12-23 MED ORDER — TOPIRAMATE 50 MG PO TABS: 50.0000 mg | ORAL_TABLET | Freq: Two times a day (BID) | ORAL | 12 refills | Status: DC

## 2020-12-23 MED ORDER — UBRELVY 50 MG PO TABS: 50.0000 mg | ORAL_TABLET | ORAL | 6 refills | Status: DC | PRN

## 2020-12-23 NOTE — Telephone Encounter (Signed)
LVM informing her that Dr Marjory Lies sent in new Rx for increased topamax. Advised she begin by increasing night dose to 100 mg to see if that is effective. Advised he sent in Ubrelvy to take place of Nurtec. It will require a PA which I can do tomorrow. advised I'll let her know the outcome of PA.

## 2020-12-23 NOTE — Telephone Encounter (Signed)
Ubrelvy PA, [id number], [id number].  Your information has been sent to [employer].

## 2020-12-23 NOTE — Telephone Encounter (Signed)
Can try ubrelvy.  Increase TPX to 50 / 100. Then 100mg  twice a day   Meds ordered this encounter  Medications  . topiramate (TOPAMAX) 50 MG tablet    Sig: Take 1-2 tablets (50-100 mg total) by mouth 2 (two) times daily.    Dispense:  120 tablet    Refill:  12  . Ubrogepant (UBRELVY) 50 MG TABS    Sig: Take 50 mg by mouth as needed. May repeat x 1 tab after 2 hours; max 2 tabs per day or 8 per month    Dispense:  8 tablet    Refill:  6    , MD 12/23/2020, 5:10 PM Certified in Neurology, Neurophysiology and Neuroimaging  [facility] [address], [address] [place], [place] [phone]

## 2020-12-23 NOTE — Telephone Encounter (Signed)
Pt called stating that the topiramate (TOPAMAX) 50 MG tablet is no longer helping her and she would like to know if she can take two at a time to see if that would help. Please advise.

## 2020-12-23 NOTE — Telephone Encounter (Signed)
Called patient who stated at first topamax took away headaches. Her cardiologist ordered a loop recorder this week. She is stressing over that, and her migraines are worse. Nurtec isn't helping much. She asked if she can double up Topamax dose; is currently taking 50 mg twice daily. I advised will let her know MD recommendations.  Patient verbalized understanding, appreciation.

## 2020-12-24 NOTE — Telephone Encounter (Signed)
Bethany Fleming approved by [facility] 12/23/20-12/23/2021. Faxed approval letter to pharmacy.

## 2020-12-24 NOTE — Telephone Encounter (Signed)
Called patient and advised Bethany Fleming is approved. Patient verbalized understanding, appreciation.

## 2021-01-05 ENCOUNTER — Ambulatory Visit: Payer: Medicaid Other | Admitting: Diagnostic Neuroimaging

## 2021-01-05 DIAGNOSIS — R519 Headache, unspecified: Secondary | ICD-10-CM

## 2021-01-05 DIAGNOSIS — G40909 Epilepsy, unspecified, not intractable, without status epilepticus: Secondary | ICD-10-CM

## 2021-01-12 NOTE — Procedures (Signed)
   [facility]  EEG (ELECTROENCEPHALOGRAM) REPORT   STUDY DATE: 01/05/2021 PATIENT NAME: Bethany Fleming DOB: Jan 20, 1986 MRN: [id number]  ORDERING CLINICIAN: Joycelyn Schmid, MD   TECHNOLOGIST: Ardis Hughs  TECHNIQUE: Electroencephalogram was recorded utilizing standard 10-20 system of lead placement and reformatted into average and bipolar montages.  RECORDING TIME: 29 minutes ACTIVATION: hyperventilation and photic stimulation  CLINICAL INFORMATION: 35 year old female with recurrent seizures.  FINDINGS: Posterior dominant background rhythms, which attenuate with eye opening, ranging 10-11 hertz and 10-20 microvolts.  Rare sharply contoured waves noted in the right frontotemporal region, without phase reversal or after going slow wave, not likely epileptiform.  No focal, lateralizing, epileptiform activity or seizures are seen. Patient recorded in the awake and drowsy state. EKG channel shows regular rhythm of 70-80 beats per minute.   IMPRESSION:   Normal EEG in the awake and drowsy states.    INTERPRETING PHYSICIAN:  Suanne Marker, MD Certified in Neurology, Neurophysiology and Neuroimaging  [facility] [address] [place], [place] [address] [phone]

## 2021-01-19 ENCOUNTER — Telehealth: Payer: Self-pay | Admitting: *Deleted

## 2021-01-19 NOTE — Telephone Encounter (Signed)
LVM informing patient her EEG was normal. Left # for questions. 

## 2021-01-26 ENCOUNTER — Telehealth (HOSPITAL_COMMUNITY): Payer: Medicaid Other | Admitting: Psychiatry

## 2021-05-27 ENCOUNTER — Encounter: Payer: Self-pay | Admitting: Family Medicine

## 2021-05-27 ENCOUNTER — Ambulatory Visit: Payer: Medicaid Other | Admitting: Family Medicine

## 2021-05-27 NOTE — Progress Notes (Deleted)
No chief complaint on file.    HISTORY OF PRESENT ILLNESS: 05/27/21 ALL:  Nickola Lenig is a 35 y.o. female here today for follow up for abnormal spells. She was last seen 11/2020. Levetiracetam 1500mg  continued and topiramate 50mg  BID added for headaches. She increased dose to 100mg  BID in 12/2020. MRI and EEG were normal.    HISTORY (copied from Dr previous note)  UPDATE (11/26/20, VRP): Since last visit, under high stress for last 2-3 weeks. Was helping a sister in law, by going to her house to stay with her. Another sister in law objected and call DSS and filed charges against patient. Since then, patient has had more spells and seizures. Also had COVID in Jan 2022. Last seizure like event 11/24/20. Also having more headaches (global, nausea, sens to light, seeing spots; 4 / week)   UPDATE (08/05/20, VRP): Since last visit, lost insurance, had reduced LEV dosing on her own x 1 year. Poor sleep and nutrition. High stress from taking care of kids and trying to work at the same time. Has had breakthrough seizures and syncope in last 1 week.    UPDATE (10/17/17, VRP): Since last visit, now pregnant  ([redacted]wks EGA; due date March 25, 2018). Has had a few more seizure events (Oct 2018; Nov 2018; Dec 2018), but less in duration. Staring spells, LOC, some shaking. Tolerating LEV 500mg  twice a day. No alleviating or aggravating factors.    PRIOR HPI (07/05/17): 35 year old female here for evaluation of seizures. Since 2016 patient has had 10-15 episodes of loss of consciousness with convulsions. She has had other episodes of memory lapses as well. These occur during daytime or while she is sleeping. Patient had a seizure during sleep, which patient's husband witnessed in July 2018; she then was staring, blinking and had a second seizure, before going to the emergency room, and was diagnosed with possible new onset seizure and referred to PCP. Patient was then referred to neurology as well.  Patient has family history of seizure in her own mother and son. No specific triggering or aggravating factors. Last Tuesday patient had an episode where she will remembers getting into the shower, but woke up on the bottom of the shower with her dog standing over her licking her face. Patient is not sure how she passed out. She had soreness on her head and thinks she hit it when she fell down.   REVIEW OF SYSTEMS: Out of a complete 14 system review of symptoms, the patient complains only of the following symptoms, and all other reviewed systems are negative.   ALLERGIES: Allergies  Allergen Reactions   Other Anaphylaxis    Watermelon, peaches   Tomato Anaphylaxis    unknown     HOME MEDICATIONS: Outpatient Medications Prior to Visit  Medication Sig Dispense Refill   ALPRAZolam (XANAX) 0.5 MG tablet for sedation before MRI scan; take 1 tab 1 hour before scan; may repeat 1 tab 15 min before scan 3 tablet 0   famotidine (PEPCID) 10 MG tablet Take 20 mg by mouth 2 (two) times daily. OTC     levETIRAcetam (KEPPRA) 750 MG tablet Take 2 tablets (1,500 mg total) by mouth 2 (two) times daily. 360 tablet 4   ondansetron (ZOFRAN-ODT) 4 MG disintegrating tablet Take 4 mg by mouth every 8 (eight) hours as needed for nausea.     topiramate (TOPAMAX) 50 MG tablet Take 1-2 tablets (50-100 mg total) by mouth 2 (two) times daily. 120 tablet 12  Ubrogepant (UBRELVY) 50 MG TABS Take 50 mg by mouth as needed. May repeat x 1 tab after 2 hours; max 2 tabs per day or 8 per month 8 tablet 6   No facility-administered medications prior to visit.     PAST MEDICAL HISTORY: Past Medical History:  Diagnosis Date   Migraine    Seizures (HCC)    most recent possibly 06/28/17     PAST SURGICAL HISTORY: Past Surgical History:  Procedure Laterality Date   CHOLECYSTECTOMY     TUBAL LIGATION       FAMILY HISTORY: Family History  Problem Relation Age of Onset   Cancer Mother        breast, lung    Diabetes Mother    Seizures Mother    Stroke Father    Cancer Father        hepatic   Cerebral palsy Sister      SOCIAL HISTORY: Social History   Socioeconomic History   Marital status: Married    Spouse name: [name]   Number of children: 5   Years of education: Not on file   Highest education level: Not on file  Occupational History    Comment: NA  Tobacco Use   Smoking status: Former    Types: Cigarettes    Start date: 07/05/2010    Quit date: 07/05/2012    Years since quitting: 8.8   Smokeless tobacco: Never  Substance and Sexual Activity   Alcohol use: No   Drug use: No   Sexual activity: Yes    Birth control/protection: Pill  Other Topics Concern   Not on file  Social History Narrative   Lives with family   Caffeine- sometimes   Social Determinants of Health   Financial Resource Strain: Not on file  Food Insecurity: Not on file  Transportation Needs: Not on file  Physical Activity: Not on file  Stress: Not on file  Social Connections: Not on file  Intimate Partner Violence: Not on file     PHYSICAL EXAM  There were no vitals filed for this visit. There is no height or weight on file to calculate BMI.   Generalized: Well developed, in no acute distress  Cardiology: normal rate and rhythm, no murmur auscultated  Respiratory: clear to auscultation bilaterally    Neurological examination  Mentation: Alert oriented to time, place, history taking. Follows all commands speech and language fluent Cranial nerve II-XII: Pupils were equal round reactive to light. Extraocular movements were full, visual field were full on confrontational test. Facial sensation and strength were normal. Uvula tongue midline. Head turning and shoulder shrug  were normal and symmetric. Motor: The motor testing reveals 5 over 5 strength of all 4 extremities. Good symmetric motor tone is noted throughout.  Sensory: Sensory testing is intact to soft touch on all 4 extremities. No  evidence of extinction is noted.  Coordination: Cerebellar testing reveals good finger-nose-finger and heel-to-shin bilaterally.  Gait and station: Gait is normal. Tandem gait is normal. Romberg is negative. No drift is seen.  Reflexes: Deep tendon reflexes are symmetric and normal bilaterally.    DIAGNOSTIC DATA (LABS, IMAGING, TESTING) - I reviewed patient records, labs, notes, testing and imaging myself where available.  Lab Results  Component Value Date   WBC 7.9 08/08/2017   HGB 13.4 08/08/2017   HCT 39.6 08/08/2017   MCV 84.3 08/08/2017   PLT 231 08/08/2017   No results found for: NA, K, CL, CO2, GLUCOSE, BUN, CREATININE, CALCIUM, PROT, ALBUMIN,  AST, ALT, ALKPHOS, BILITOT, GFRNONAA, GFRAA No results found for: CHOL, HDL, LDLCALC, LDLDIRECT, TRIG, CHOLHDL No results found for: FKCL2X No results found for: VITAMINB12 No results found for: TSH  No flowsheet data found.   No flowsheet data found.   ASSESSMENT AND PLAN  35 y.o. year old female  has a past medical history of Migraine and Seizures (HCC). here with    No diagnosis found.   No orders of the defined types were placed in this encounter.    No orders of the defined types were placed in this encounter.     Shawnie Dapper, MSN, FNP-C 05/27/2021, 8:21 AM  [facility] [address] [place], [place] [address] [phone]

## 2021-08-05 NOTE — Patient Instructions (Incomplete)
Below is our plan:  We will continue levetiracetam 1500mg  twice daily  Please make sure you are staying well hydrated. I recommend 50-60 ounces daily. Well balanced diet and regular exercise encouraged. Consistent sleep schedule with 6-8 hours recommended.   Please continue follow up with care team as directed.   Follow up with *** in ***  You may receive a survey regarding today's visit. I encourage you to leave honest feed back as I do use this information to improve patient care. Thank you for seeing me today!

## 2021-08-05 NOTE — Progress Notes (Deleted)
No chief complaint on file.    HISTORY OF PRESENT ILLNESS:  08/05/21 ALL:  Bethany Fleming is a 35 y.o. female here today for follow up for seizure versus syncope versus pseudoseizure and migraines. She reported increased stress with more starring spells and headaches at last follow up with Dr Marjory Lies 11/2020. MRI and EEG normal. Levetiracetam 1500mg  BID continued. Topiramate added and increased to 100mg  BID. for abortive therapy.    HISTORY (copied from Dr previous note)  UPDATE (11/26/20, VRP): Since last visit, under high stress for last 2-3 weeks. Was helping a sister in law, by going to her house to stay with her. Another sister in law objected and call DSS and filed charges against patient. Since then, patient has had more spells and seizures. Also had COVID in Jan 2022. Last seizure like event 11/24/20. Also having more headaches (global, nausea, sens to light, seeing spots; 4 / week)   UPDATE (08/05/20, VRP): Since last visit, lost insurance, had reduced LEV dosing on her own x 1 year. Poor sleep and nutrition. High stress from taking care of kids and trying to work at the same time. Has had breakthrough seizures and syncope in last 1 week.    UPDATE (10/17/17, VRP): Since last visit, now pregnant  ([redacted]wks EGA; due date March 25, 2018). Has had a few more seizure events (Oct 2018; Nov 2018; Dec 2018), but less in duration. Staring spells, LOC, some shaking. Tolerating LEV 500mg  twice a day. No alleviating or aggravating factors.    PRIOR HPI (07/05/17): 35 year old female here for evaluation of seizures. Since 2016 patient has had 10-15 episodes of loss of consciousness with convulsions. She has had other episodes of memory lapses as well. These occur during daytime or while she is sleeping. Patient had a seizure during sleep, which patient's husband witnessed in July 2018; she then was staring, blinking and had a second seizure, before going to the emergency room, and  was diagnosed with possible new onset seizure and referred to PCP. Patient was then referred to neurology as well. Patient has family history of seizure in her own mother and son. No specific triggering or aggravating factors. Last Tuesday patient had an episode where she will remembers getting into the shower, but woke up on the bottom of the shower with her dog standing over her licking her face. Patient is not sure how she passed out. She had soreness on her head and thinks she hit it when she fell down.   REVIEW OF SYSTEMS: Out of a complete 14 system review of symptoms, the patient complains only of the following symptoms, and all other reviewed systems are negative.   ALLERGIES: Allergies  Allergen Reactions   Other Anaphylaxis    Watermelon, peaches   Tomato Anaphylaxis    unknown     HOME MEDICATIONS: Outpatient Medications Prior to Visit  Medication Sig Dispense Refill   ALPRAZolam (XANAX) 0.5 MG tablet for sedation before MRI scan; take 1 tab 1 hour before scan; may repeat 1 tab 15 min before scan 3 tablet 0   famotidine (PEPCID) 10 MG tablet Take 20 mg by mouth 2 (two) times daily. OTC     levETIRAcetam (KEPPRA) 750 MG tablet Take 2 tablets (1,500 mg total) by mouth 2 (two) times daily. 360 tablet 4   ondansetron (ZOFRAN-ODT) 4 MG disintegrating tablet Take 4 mg by mouth every 8 (eight) hours as needed for nausea.     topiramate (TOPAMAX) 50 MG tablet  Take 1-2 tablets (50-100 mg total) by mouth 2 (two) times daily. 120 tablet 12   Ubrogepant (UBRELVY) 50 MG TABS Take 50 mg by mouth as needed. May repeat x 1 tab after 2 hours; max 2 tabs per day or 8 per month 8 tablet 6   No facility-administered medications prior to visit.     PAST MEDICAL HISTORY: Past Medical History:  Diagnosis Date   Migraine    Seizures (HCC)    most recent possibly 06/28/17     PAST SURGICAL HISTORY: Past Surgical History:  Procedure Laterality Date   CHOLECYSTECTOMY     TUBAL LIGATION        FAMILY HISTORY: Family History  Problem Relation Age of Onset   Cancer Mother        breast, lung   Diabetes Mother    Seizures Mother    Stroke Father    Cancer Father        hepatic   Cerebral palsy Sister      SOCIAL HISTORY: Social History   Socioeconomic History   Marital status: Married    Spouse name: [name]   Number of children: 5   Years of education: Not on file   Highest education level: Not on file  Occupational History    Comment: NA  Tobacco Use   Smoking status: Former    Types: Cigarettes    Start date: 07/05/2010    Quit date: 07/05/2012    Years since quitting: 9.0   Smokeless tobacco: Never  Substance and Sexual Activity   Alcohol use: No   Drug use: No   Sexual activity: Yes    Birth control/protection: Pill  Other Topics Concern   Not on file  Social History Narrative   Lives with family   Caffeine- sometimes   Social Determinants of Health   Financial Resource Strain: Not on file  Food Insecurity: Not on file  Transportation Needs: Not on file  Physical Activity: Not on file  Stress: Not on file  Social Connections: Not on file  Intimate Partner Violence: Not on file     PHYSICAL EXAM  There were no vitals filed for this visit. There is no height or weight on file to calculate BMI.  Generalized: Well developed, in no acute distress  Cardiology: normal rate and rhythm, no murmur auscultated  Respiratory: clear to auscultation bilaterally    Neurological examination  Mentation: Alert oriented to time, place, history taking. Follows all commands speech and language fluent Cranial nerve II-XII: Pupils were equal round reactive to light. Extraocular movements were full, visual field were full on confrontational test. Facial sensation and strength were normal. Uvula tongue midline. Head turning and shoulder shrug  were normal and symmetric. Motor: The motor testing reveals 5 over 5 strength of all 4 extremities. Good symmetric  motor tone is noted throughout.  Sensory: Sensory testing is intact to soft touch on all 4 extremities. No evidence of extinction is noted.  Coordination: Cerebellar testing reveals good finger-nose-finger and heel-to-shin bilaterally.  Gait and station: Gait is normal. Tandem gait is normal. Romberg is negative. No drift is seen.  Reflexes: Deep tendon reflexes are symmetric and normal bilaterally.    DIAGNOSTIC DATA (LABS, IMAGING, TESTING) - I reviewed patient records, labs, notes, testing and imaging myself where available.  Lab Results  Component Value Date   WBC 7.9 08/08/2017   HGB 13.4 08/08/2017   HCT 39.6 08/08/2017   MCV 84.3 08/08/2017   PLT 231 08/08/2017  No results found for: NA, K, CL, CO2, GLUCOSE, BUN, CREATININE, CALCIUM, PROT, ALBUMIN, AST, ALT, ALKPHOS, BILITOT, GFRNONAA, GFRAA No results found for: CHOL, HDL, LDLCALC, LDLDIRECT, TRIG, CHOLHDL No results found for: PNPY0F No results found for: VITAMINB12 No results found for: TSH  No flowsheet data found.   No flowsheet data found.   ASSESSMENT AND PLAN  35 y.o. year old female  has a past medical history of Migraine and Seizures (HCC). here with    Seizure disorder (HCC)  Migraine with aura and without status migrainosus, not intractable  Anxiety  PTSD (post-traumatic stress disorder)   No orders of the defined types were placed in this encounter.    No orders of the defined types were placed in this encounter.     Shawnie Dapper, MSN, FNP-C 08/05/2021, 12:47 PM  [facility] [address], [address] [place], [place] [address] [phone]

## 2021-08-06 ENCOUNTER — Encounter: Payer: Self-pay | Admitting: Family Medicine

## 2021-08-06 ENCOUNTER — Ambulatory Visit: Payer: Medicaid Other | Admitting: Family Medicine

## 2021-08-06 DIAGNOSIS — F4310 Post-traumatic stress disorder, unspecified: Secondary | ICD-10-CM

## 2021-08-06 DIAGNOSIS — G43109 Migraine with aura, not intractable, without status migrainosus: Secondary | ICD-10-CM

## 2021-08-06 DIAGNOSIS — F419 Anxiety disorder, unspecified: Secondary | ICD-10-CM

## 2021-08-06 DIAGNOSIS — G40909 Epilepsy, unspecified, not intractable, without status epilepticus: Secondary | ICD-10-CM

## 2021-11-25 ENCOUNTER — Telehealth: Payer: Self-pay | Admitting: *Deleted

## 2021-11-25 NOTE — Telephone Encounter (Signed)
Bernita Raisin PA, key [id number]. Your information has been sent to [employer].

## 2021-11-26 NOTE — Telephone Encounter (Signed)
Received fax from [facility] re: need additional clinical information. Questions answered, form faxed to [facility], received confirmation.

## 2021-11-26 NOTE — Telephone Encounter (Signed)
Bethany Fleming Approved through 11/25/2022, approval letter faxed to pharmacy.

## 2022-02-08 ENCOUNTER — Other Ambulatory Visit: Payer: Self-pay | Admitting: Diagnostic Neuroimaging

## 2022-02-08 ENCOUNTER — Telehealth: Payer: Self-pay | Admitting: Diagnostic Neuroimaging

## 2022-02-08 NOTE — Telephone Encounter (Signed)
Pt's husband, Siera Kerschner. Pt had seizure this morning, pt was heading to the bathroom and fell and hit head on the chair and had a multiple seizures.Daughter called EMS. EMS witnessed pt have a seizure. EMS transported [facility] At this time pt is in ER. Would like a call from the nurse to discuss a sooner appt.  ?

## 2022-02-08 NOTE — Telephone Encounter (Signed)
I returned pt's husband call and f/u has been scheduled for 02/18/2022 at 300 pm with Dr. Marjory Lies.  ?

## 2022-02-18 ENCOUNTER — Ambulatory Visit: Payer: Medicaid Other | Admitting: Diagnostic Neuroimaging

## 2022-02-18 ENCOUNTER — Encounter: Payer: Self-pay | Admitting: Diagnostic Neuroimaging

## 2022-02-18 VITALS — BP 139/69 | HR 79 | Ht 62.0 in | Wt 243.0 lb

## 2022-02-18 DIAGNOSIS — G40909 Epilepsy, unspecified, not intractable, without status epilepticus: Secondary | ICD-10-CM | POA: Diagnosis not present

## 2022-02-18 DIAGNOSIS — G43109 Migraine with aura, not intractable, without status migrainosus: Secondary | ICD-10-CM | POA: Diagnosis not present

## 2022-02-18 MED ORDER — TOPIRAMATE 50 MG PO TABS: 50.0000 mg | ORAL_TABLET | Freq: Two times a day (BID) | ORAL | 4 refills | Status: AC

## 2022-02-18 MED ORDER — ONDANSETRON HCL 4 MG PO TABS: 4.0000 mg | ORAL_TABLET | Freq: Three times a day (TID) | ORAL | 0 refills | Status: AC | PRN

## 2022-02-18 MED ORDER — LEVETIRACETAM 750 MG PO TABS: 1500.0000 mg | ORAL_TABLET | Freq: Two times a day (BID) | ORAL | 4 refills | Status: AC

## 2022-02-18 NOTE — Progress Notes (Signed)
[facility]  PATIENT: Bethany Fleming DOB: 12-Jul-1986  REFERRING CLINICIAN: Kyla Balzarine, PA-C  HISTORY FROM: patient and husband REASON FOR VISIT: follow up   HISTORICAL  CHIEF COMPLAINT:  Chief Complaint  Patient presents with   Seizures    Rm 7 with spouse Bethany Fleming Pt is well, here to FU on recent sz     HISTORY OF PRESENT ILLNESS:   UPDATE (02/18/22, VRP): Since last visit, doing well until 02/08/22 had multiple seizures at home. Also may have hit head on chair. Had run out of meds x 2 weeks. Went to [facility] for eval. Now back on meds. Still with some nausea, malaise issues.   UPDATE (11/26/20, VRP): Since last visit, under high stress for last 2-3 weeks. Was helping a sister in law, by going to her house to stay with her. Another sister in law objected and call DSS and filed charges against patient. Since then, patient has had more spells and seizures. Also had COVID in Jan 2022. Last seizure like event 11/24/20. Also having more headaches (global, nausea, sens to light, seeing spots; 4 / week)  UPDATE (08/05/20, VRP): Since last visit, lost insurance, had reduced LEV dosing on her own x 1 year. Poor sleep and nutrition. High stress from taking care of kids and trying to work at the same time. Has had breakthrough seizures and syncope in last 1 week.   UPDATE (10/17/17, VRP): Since last visit, now pregnant  ([redacted]wks EGA; due date March 25, 2018). Has had a few more seizure events (Oct 2018; Nov 2018; Dec 2018), but less in duration. Staring spells, LOC, some shaking. Tolerating LEV 500mg  twice a day. No alleviating or aggravating factors.   PRIOR HPI (07/05/17): 36 year old female here for evaluation of seizures. Since 2016 patient has had 10-15 episodes of loss of consciousness with convulsions. She has had other episodes of memory lapses as well. These occur during daytime or while she is sleeping. Patient had a seizure during sleep, which patient's husband  witnessed in July 2018; she then was staring, blinking and had a second seizure, before going to the emergency room, and was diagnosed with possible new onset seizure and referred to PCP. Patient was then referred to neurology as well. Patient has family history of seizure in her own mother and son. No specific triggering or aggravating factors. Last Tuesday patient had an episode where she will remembers getting into the shower, but woke up on the bottom of the shower with her dog standing over her licking her face. Patient is not sure how she passed out. She had soreness on her head and thinks she hit it when she fell down.   REVIEW OF SYSTEMS: Full 14 system review of systems performed and negative with exception of: as per HPI.  ALLERGIES: Allergies  Allergen Reactions   Other Anaphylaxis    Watermelon, peaches   Tomato Anaphylaxis    unknown    HOME MEDICATIONS: Outpatient Medications Prior to Visit  Medication Sig Dispense Refill   levETIRAcetam (KEPPRA) 750 MG tablet Take 2 tablets by mouth twice daily 360 tablet 0   topiramate (TOPAMAX) 50 MG tablet TAKE 1 TO 2 TABLETS BY MOUTH TWICE DAILY 120 tablet 0   ALPRAZolam (XANAX) 0.5 MG tablet for sedation before MRI scan; take 1 tab 1 hour before scan; may repeat 1 tab 15 min before scan (Patient not taking: Reported on 02/18/2022) 3 tablet 0   famotidine (PEPCID) 10 MG tablet Take 20 mg by  mouth 2 (two) times daily. OTC (Patient not taking: Reported on 02/18/2022)     ondansetron (ZOFRAN-ODT) 4 MG disintegrating tablet Take 4 mg by mouth every 8 (eight) hours as needed for nausea. (Patient not taking: Reported on 02/18/2022)     Ubrogepant (UBRELVY) 50 MG TABS Take 50 mg by mouth as needed. May repeat x 1 tab after 2 hours; max 2 tabs per day or 8 per month (Patient not taking: Reported on 02/18/2022) 8 tablet 6   No facility-administered medications prior to visit.    PAST MEDICAL HISTORY: Past Medical History:  Diagnosis Date    Migraine    Seizures (HCC)    most recent possibly 06/28/17    PAST SURGICAL HISTORY: Past Surgical History:  Procedure Laterality Date   CHOLECYSTECTOMY     TUBAL LIGATION      FAMILY HISTORY: Family History  Problem Relation Age of Onset   Cancer Mother        breast, lung   Diabetes Mother    Seizures Mother    Stroke Father    Cancer Father        hepatic   Cerebral palsy Sister     SOCIAL HISTORY:  Social History   Socioeconomic History   Marital status: Married    Spouse name: Bethany FastRonald   Number of children: 5   Years of education: Not on file   Highest education level: Not on file  Occupational History    Comment: NA  Tobacco Use   Smoking status: Former    Types: Cigarettes    Start date: 07/05/2010    Quit date: 07/05/2012    Years since quitting: 9.6   Smokeless tobacco: Never  Substance and Sexual Activity   Alcohol use: No   Drug use: No   Sexual activity: Yes    Birth control/protection: Pill  Other Topics Concern   Not on file  Social History Narrative   Lives with family   Caffeine- sometimes   Social Determinants of Health   Financial Resource Strain: Not on file  Food Insecurity: Not on file  Transportation Needs: Not on file  Physical Activity: Not on file  Stress: Not on file  Social Connections: Not on file  Intimate Partner Violence: Not on file     PHYSICAL EXAM  GENERAL EXAM/CONSTITUTIONAL: Vitals:  Vitals:   02/18/22 1459  BP: 139/69  Pulse: 79  Weight: 243 lb (110.2 kg)  Height: 5\' 2"  (1.575 m)   Body mass index is 44.45 kg/m. No results found. Patient is in no distress; well developed, nourished and groomed; neck is supple  CARDIOVASCULAR: Examination of carotid arteries is normal; no carotid bruits Regular rate and rhythm, no murmurs Examination of peripheral vascular system by observation and palpation is normal  EYES: Ophthalmoscopic exam of optic discs and posterior segments is normal; no papilledema or  hemorrhages  MUSCULOSKELETAL: Gait, strength, tone, movements noted in Neurologic exam below  NEUROLOGIC: MENTAL STATUS:      View : No data to display.         awake, alert, oriented to person, place and time recent and remote memory intact normal attention and concentration language fluent, comprehension intact, naming intact,  fund of knowledge appropriate  CRANIAL NERVE:  2nd - no papilledema on fundoscopic exam 2nd, 3rd, 4th, 6th - pupils equal and reactive to light, visual fields full to confrontation, extraocular muscles intact, no nystagmus 5th - facial sensation symmetric 7th - facial strength symmetric 8th - hearing  intact 9th - palate elevates symmetrically, uvula midline 11th - shoulder shrug symmetric 12th - tongue protrusion midline  MOTOR:  normal bulk and tone, full strength in the BUE, BLE  SENSORY:  normal and symmetric to light touch, temperature, vibration  COORDINATION:  finger-nose-finger, fine finger movements normal  REFLEXES:  deep tendon reflexes present and symmetric  GAIT/STATION:  narrow based gait    DIAGNOSTIC DATA (LABS, IMAGING, TESTING) - I reviewed patient records, labs, notes, testing and imaging myself where available.  Lab Results  Component Value Date   WBC 7.9 08/08/2017   HGB 13.4 08/08/2017   HCT 39.6 08/08/2017   MCV 84.3 08/08/2017   PLT 231 08/08/2017   No results found for: NA, K, CL, CO2, GLUCOSE, BUN, CREATININE, CALCIUM, PROT, ALBUMIN, AST, ALT, ALKPHOS, BILITOT, GFRNONAA, GFRAA No results found for: CHOL, HDL, LDLCALC, LDLDIRECT, TRIG, CHOLHDL No results found for: YTKZ6W No results found for: VITAMINB12 No results found for: TSH   04/25/17 CT head [I reviewed images myself and agree with interpretation. -VRP]  - no acute findings  08/14/17 MRI of the brain without contrast [I reviewed images myself. Few non-specific foci of gliosis. -VRP]  1.  Few scattered T2/FLAIR hyperintense foci in the  subcortical and deep white matter. This is a nonspecific finding and could be due to minimal chronic microvascular ischemic change or be the sequelae of chronic migraine or small cardio-embolic events.  Demyelination is unlikely. None of these appear to be acute. 2.   There are no acute findings.  09/20/17 TTE - Left ventricle: The cavity size was normal. Systolic function was   normal. The estimated ejection fraction was in the range of 55%   to 60%. Wall motion was normal; there were no regional wall   motion abnormalities. Left ventricular diastolic function   parameters were normal.  12/03/21 MRI brain - Unremarkable MRI brain (with and without).  Few stable nonspecific foci of gliosis.  No acute findings.  01/05/21 EEG - normal    ASSESSMENT AND PLAN  36 y.o. year old female here with multiple episodes of loss of consciousness with convulsions, postictal confusion since 2016, consistent with seizure disorder.  Last seizures 10/02/17, 08/01/20, 11/24/20, 02/08/22  Dx: seizure disorder  1. Seizure disorder (HCC)   2. Migraine with aura and without status migrainosus, not intractable      PLAN:   ABNORMAL SPELLS (seizure disorder; +/- syncope events) --> last spells 11/24/20 and 02/08/22  - CONTINUE levetiracetam 1500mg  twice a day  - will setup video EEG monitoring  - no driving until at least 3 months seizure free (last seizure was provoked by running out of meds x 2 weeks)  - Please maintain precautions. Do not participate in activities where a loss of awareness could harm you or someone else. No swimming alone, no tub bathing, no hot tubs, no driving, no operating motorized vehicles (cars, ATVs, motocycles, etc), lawnmowers, power tools or firearms. No standing at heights, such as rooftops, ladders or stairs. Avoid hot objects such as stoves, heaters, open fires. Wear a helmet when riding a bicycle, scooter, skateboard, etc. and avoid areas of traffic. Set your water heater to  120 degrees or less.   MIGRAINE WITH AURA - continue topiramate 50mg  twice a day (can help migraine and seizures)   Meds ordered this encounter  Medications   levETIRAcetam (KEPPRA) 750 MG tablet    Sig: Take 2 tablets (1,500 mg total) by mouth 2 (two) times daily.  Dispense:  360 tablet    Refill:  4   topiramate (TOPAMAX) 50 MG tablet    Sig: Take 1 tablet (50 mg total) by mouth 2 (two) times daily.    Dispense:  180 tablet    Refill:  4   ondansetron (ZOFRAN) 4 MG tablet    Sig: Take 1 tablet (4 mg total) by mouth every 8 (eight) hours as needed for nausea or vomiting.    Dispense:  20 tablet    Refill:  0   Return in about 1 year (around 02/19/2023) for MyChart visit (15 min).    Suanne Marker, MD 02/18/2022, 3:30 PM Certified in Neurology, Neurophysiology and Neuroimaging  [facility] [address], [address] [place], [place] [address] [phone]

## 2023-02-22 ENCOUNTER — Telehealth: Payer: Medicaid Other | Admitting: Diagnostic Neuroimaging

## 2024-08-11 LAB — CBC AND DIFFERENTIAL
HCT: 41 (ref 36–46)
Hemoglobin: 14.3 (ref 12.0–16.0)
Platelets: 271 K/uL (ref 150–400)
WBC: 10

## 2024-08-11 LAB — COMPREHENSIVE METABOLIC PANEL WITH GFR
Albumin: 4 (ref 3.5–5.0)
Calcium: 9.2 (ref 8.7–10.7)
eGFR: 120

## 2024-08-11 LAB — BASIC METABOLIC PANEL WITH GFR
BUN: 9 (ref 4–21)
CO2: 23 — AB (ref 13–22)
Chloride: 106 (ref 99–108)
Creatinine: 0.6 (ref 0.5–1.1)
Glucose: 159
Potassium: 4.2 meq/L (ref 3.5–5.1)
Sodium: 138 (ref 137–147)

## 2024-08-11 LAB — HEPATIC FUNCTION PANEL
ALT: 24 U/L (ref 7–35)
AST: 25 (ref 13–35)
Alkaline Phosphatase: 68 (ref 25–125)

## 2024-08-11 LAB — CBC: RBC: 4.93 (ref 3.87–5.11)

## 2024-08-21 ENCOUNTER — Encounter: Payer: Self-pay | Admitting: Family Medicine

## 2024-08-21 ENCOUNTER — Ambulatory Visit: Admitting: Family Medicine

## 2024-08-21 VITALS — BP 108/72 | HR 95 | Temp 97.5°F | Resp 18 | Ht 62.0 in | Wt 254.8 lb

## 2024-08-21 DIAGNOSIS — R7309 Other abnormal glucose: Secondary | ICD-10-CM | POA: Diagnosis not present

## 2024-08-21 DIAGNOSIS — Z833 Family history of diabetes mellitus: Secondary | ICD-10-CM | POA: Insufficient documentation

## 2024-08-21 DIAGNOSIS — G40909 Epilepsy, unspecified, not intractable, without status epilepticus: Secondary | ICD-10-CM | POA: Diagnosis not present

## 2024-08-21 DIAGNOSIS — H1033 Unspecified acute conjunctivitis, bilateral: Secondary | ICD-10-CM | POA: Insufficient documentation

## 2024-08-21 DIAGNOSIS — E559 Vitamin D deficiency, unspecified: Secondary | ICD-10-CM | POA: Insufficient documentation

## 2024-08-21 DIAGNOSIS — N9489 Other specified conditions associated with female genital organs and menstrual cycle: Secondary | ICD-10-CM | POA: Insufficient documentation

## 2024-08-21 DIAGNOSIS — R160 Hepatomegaly, not elsewhere classified: Secondary | ICD-10-CM | POA: Insufficient documentation

## 2024-08-21 DIAGNOSIS — N200 Calculus of kidney: Secondary | ICD-10-CM | POA: Insufficient documentation

## 2024-08-21 MED ORDER — MOXIFLOXACIN HCL 0.5 % OP SOLN: 1.0000 [drp] | Freq: Three times a day (TID) | OPHTHALMIC | 0 refills | Status: AC

## 2024-08-21 NOTE — Assessment & Plan Note (Signed)
 Low vitamin D levels with history of anemia during pregnancy and persistent deficiency despite supplementation. - Ordered vitamin D level test. - Discussed vitamin D supplementation options, including IM and liquid

## 2024-08-21 NOTE — Assessment & Plan Note (Signed)
 Bilateral acute conjunctivitis Acute onset of burning, itching, and sticky discharge in both eyes, left eye more affected. Possible bacterial, viral, or allergic etiology. - Prescribed ofloxacin eye drops for bacterial conjunctivitis.

## 2024-08-21 NOTE — Assessment & Plan Note (Signed)
 Seizure disorder and migraine Seizure disorder managed with Keppra  and Topamax . Last seizure almost a year ago. Migraines managed with Topamax . Neurologist involved. - Continue current medications (Keppra  and Topamax ).

## 2024-08-21 NOTE — Progress Notes (Signed)
 77   New Patient Office Visit  Subjective    Patient ID: Bethany Fleming, female    DOB: 12-18-1985  Age: 38 y.o. MRN: [id number]  CC:  Chief Complaint  Patient presents with   Establish Care   Conjunctivitis   Discussed the use of AI scribe software for clinical note transcription with the patient, who gave verbal consent to proceed.  History of Present Illness   Bethany Fleming is a 38 year old female who presents with pink eye and abdominal pain.  Ocular symptoms - Left eye discomfort since yesterday, worsening last night - Symptoms include burning, itching, dryness, tearing, and stickiness - No treatment attempted - Avoiding touching the affected eye - No known exposure to conjunctivitis; son was ill over the weekend with fever and gastrointestinal symptoms, but without ocular involvement  Abdominal pain and adnexal mass - Centralized abdominal pain - Recent emergency room visit for evaluation - CT scan revealed a 3.8 cm left adnexal mass with central calcification - Bilateral nonobstructive renal calculi identified - History of ovarian cysts; suspects current pain may be due to unresolved large cyst - Not currently using birth control; experienced increased cyst formation while on birth control  Seizure disorder and migraine - History of seizures and migraines - Currently taking Keppra  and Topamax  with dosage adjustments as needed - Last seizure occurred nearly one year ago, triggered by stress from an accident  Glucose intolerance - Glucose level measured at 159 at the hospital - Family history of diabetes in mother (on insulin), daughter (diet-controlled), and sister - Aware of need for dietary monitoring  Anemia and vitamin deficiency - History of anemia during pregnancy - Persistent abnormal vitamin levels since pregnancy - Prefers liquid or gummy forms of medication due to hesitancy with pills related to family history of addiction      Outpatient Encounter  Medications as of 08/21/2024  Medication Sig   ibuprofen (ADVIL) 800 MG tablet Take 800 mg by mouth 3 (three) times daily.   levETIRAcetam  (KEPPRA ) 750 MG tablet Take 2 tablets (1,500 mg total) by mouth 2 (two) times daily.   moxifloxacin (VIGAMOX) 0.5 % ophthalmic solution Place 1 drop into both eyes 3 (three) times daily.   ondansetron  (ZOFRAN ) 4 MG tablet Take 1 tablet (4 mg total) by mouth every 8 (eight) hours as needed for nausea or vomiting.   topiramate  (TOPAMAX ) 50 MG tablet Take 1 tablet (50 mg total) by mouth 2 (two) times daily.   No facility-administered encounter medications on file as of 08/21/2024.    Past Medical History:  Diagnosis Date   Migraine    Seizures (HCC)    most recent possibly 06/28/17    Past Surgical History:  Procedure Laterality Date   CHOLECYSTECTOMY     TUBAL LIGATION      Family History  Problem Relation Age of Onset   Cancer Mother        breast, lung   Diabetes Mother    Seizures Mother    Stroke Father    Cancer Father        hepatic   Cerebral palsy Sister     Social History   Socioeconomic History   Marital status: Married    Spouse name: Tanda   Number of children: 5   Years of education: Not on file   Highest education level: Not on file  Occupational History    Comment: NA  Tobacco Use   Smoking status: Former    Current packs/day: 0.00  Types: Cigarettes    Start date: 07/05/2010    Quit date: 07/05/2012    Years since quitting: 12.1   Smokeless tobacco: Never  Vaping Use   Vaping status: Every Day  Substance and Sexual Activity   Alcohol use: No   Drug use: No   Sexual activity: Yes    Birth control/protection: Pill  Other Topics Concern   Not on file  Social History Narrative   Lives with family   Caffeine- sometimes   Social Drivers of Corporate Investment Banker Strain: Not on file  Food Insecurity: Not on file  Transportation Needs: Not on file  Physical Activity: Not on file  Stress: Not on  file  Social Connections: Not on file  Intimate Partner Violence: Not on file    Review of Systems  Constitutional:  Negative for chills, fever and malaise/fatigue.  HENT:  Negative for congestion, ear pain, sinus pain and sore throat.   Eyes:  Positive for pain, discharge and redness.  Respiratory:  Negative for cough, shortness of breath and wheezing.   Cardiovascular:  Negative for chest pain, palpitations and leg swelling.  Gastrointestinal:  Positive for nausea. Negative for constipation, diarrhea and vomiting.  Genitourinary:  Negative for dysuria, frequency and urgency.  Musculoskeletal: Negative.   Skin: Negative.   Neurological:  Negative for dizziness and headaches.  Endo/Heme/Allergies: Negative.   Psychiatric/Behavioral: Negative.         Objective    BP 108/72   Pulse 95   Temp (!) 97.5 F (36.4 C) (Temporal)   Resp 18   Ht 5' 2 (1.575 m)   Wt 254 lb 12.8 oz (115.6 kg)   LMP 08/06/2024 (Exact Date)   SpO2 98%   BMI 46.60 kg/m   Physical Exam Constitutional:      General: She is not in acute distress.    Appearance: Normal appearance. She is obese. She is ill-appearing.  Eyes:     General:        Right eye: Discharge present.        Left eye: Discharge present. Cardiovascular:     Rate and Rhythm: Normal rate and regular rhythm.     Heart sounds: Normal heart sounds. No murmur heard. Pulmonary:     Effort: Pulmonary effort is normal. No respiratory distress.     Breath sounds: Normal breath sounds. No wheezing.  Abdominal:     General: Bowel sounds are normal. There is distension.     Palpations: Abdomen is soft.     Tenderness: There is abdominal tenderness.  Musculoskeletal:        General: Normal range of motion.  Skin:    General: Skin is warm.  Neurological:     Mental Status: She is alert. Mental status is at baseline.  Psychiatric:        Mood and Affect: Mood normal.        Behavior: Behavior normal.         Assessment & Plan:    Problem List Items Addressed This Visit       Digestive   Hepatomegaly   Enlarged liver (hepatomegaly) Enlarged liver noted on imaging, unchanged from previous assessments. - Ordered liver function tests.        Nervous and Auditory   Seizure disorder [facility])   Seizure disorder and migraine Seizure disorder managed with Keppra  and Topamax . Last seizure almost a year ago. Migraines managed with Topamax . Neurologist involved. - Continue current medications (Keppra  and Topamax ).  Genitourinary   Multiple renal calculi   Incidental finding on abdominal CT Bilateral nonobstructive renal calculi Presence of bilateral nonobstructive renal calculi noted on imaging. - Continue to monitor for symptoms of kidney stones.        Other   Morbid obesity (HCC)   Relevant Orders   T4 AND TSH   Comprehensive metabolic panel with GFR   Lipid Panel   Elevated glucose   Abnormal glucose, possible diabetes mellitus Elevated glucose level of 159 mg/dL with family history of diabetes. Possible diabetes mellitus. - Ordered A1c test to assess for diabetes mellitus. - Discussed dietary modifications to manage glucose levels.      Relevant Orders   Hemoglobin A1c   Acute bacterial conjunctivitis of both eyes - Primary   Bilateral acute conjunctivitis Acute onset of burning, itching, and sticky discharge in both eyes, left eye more affected. Possible bacterial, viral, or allergic etiology. - Prescribed ofloxacin eye drops for bacterial conjunctivitis.      Relevant Medications   moxifloxacin (VIGAMOX) 0.5 % ophthalmic solution   Vitamin D deficiency   Low vitamin D levels with history of anemia during pregnancy and persistent deficiency despite supplementation. - Ordered vitamin D level test. - Discussed vitamin D supplementation options, including IM and liquid      Relevant Orders   Vitamin D, 25-hydroxy   Adnexal mass   Incidental finding on abdominal CT Bulky cervix with  left adnexal mass, possible ovarian cyst Bulky cervix with left adnexal mass measuring 3.8 cm with central calcification. Possible ovarian cyst. - Recommended ultrasound for further evaluation of adnexal mass.         Follow-up:Return in about 2 weeks (around 09/04/2024).   Harrie Cedar, FNP [facility] [phone]

## 2024-08-21 NOTE — Assessment & Plan Note (Signed)
 Abnormal glucose, possible diabetes mellitus Elevated glucose level of 159 mg/dL with family history of diabetes. Possible diabetes mellitus. - Ordered A1c test to assess for diabetes mellitus. - Discussed dietary modifications to manage glucose levels.

## 2024-08-21 NOTE — Assessment & Plan Note (Signed)
 Incidental finding on abdominal CT Bilateral nonobstructive renal calculi Presence of bilateral nonobstructive renal calculi noted on imaging. - Continue to monitor for symptoms of kidney stones.

## 2024-08-21 NOTE — Assessment & Plan Note (Signed)
 Incidental finding on abdominal CT Bulky cervix with left adnexal mass, possible ovarian cyst Bulky cervix with left adnexal mass measuring 3.8 cm with central calcification. Possible ovarian cyst. - Recommended ultrasound for further evaluation of adnexal mass.

## 2024-08-21 NOTE — Assessment & Plan Note (Addendum)
 Incidental finding Enlarged liver (hepatomegaly) Enlarged liver noted on imaging, unchanged from previous assessments. - Ordered liver function tests.

## 2024-08-22 ENCOUNTER — Ambulatory Visit: Payer: Self-pay | Admitting: Family Medicine

## 2024-08-22 DIAGNOSIS — E559 Vitamin D deficiency, unspecified: Secondary | ICD-10-CM

## 2024-08-22 LAB — COMPREHENSIVE METABOLIC PANEL WITH GFR
ALT: 24 IU/L (ref 0–32)
AST: 19 IU/L (ref 0–40)
Albumin: 4.3 g/dL (ref 3.9–4.9)
Alkaline Phosphatase: 73 IU/L (ref 41–116)
BUN/Creatinine Ratio: 17 (ref 9–23)
BUN: 12 mg/dL (ref 6–20)
Bilirubin Total: 0.4 mg/dL (ref 0.0–1.2)
CO2: 22 mmol/L (ref 20–29)
Calcium: 9.4 mg/dL (ref 8.7–10.2)
Chloride: 103 mmol/L (ref 96–106)
Creatinine, Ser: 0.72 mg/dL (ref 0.57–1.00)
Globulin, Total: 2.4 g/dL (ref 1.5–4.5)
Glucose: 117 mg/dL — ABNORMAL HIGH (ref 70–99)
Potassium: 4.3 mmol/L (ref 3.5–5.2)
Sodium: 138 mmol/L (ref 134–144)
Total Protein: 6.7 g/dL (ref 6.0–8.5)
eGFR: 112 mL/min/1.73 (ref >59)

## 2024-08-22 LAB — LIPID PANEL
Chol/HDL Ratio: 4.5 ratio — ABNORMAL HIGH (ref 0.0–4.4)
Cholesterol, Total: 154 mg/dL (ref 100–199)
HDL: 34 mg/dL — ABNORMAL LOW (ref >39)
LDL Chol Calc (NIH): 96 mg/dL (ref 0–99)
Triglycerides: 132 mg/dL (ref 0–149)
VLDL Cholesterol Cal: 24 mg/dL (ref 5–40)

## 2024-08-22 LAB — T4 AND TSH
T4, Total: 8.1 ug/dL (ref 4.5–12.0)
TSH: 0.775 u[IU]/mL (ref 0.450–4.500)

## 2024-08-22 LAB — VITAMIN D 25 HYDROXY (VIT D DEFICIENCY, FRACTURES): Vit D, 25-Hydroxy: 14.4 ng/mL — ABNORMAL LOW (ref 30.0–100.0)

## 2024-08-22 LAB — HEMOGLOBIN A1C
Est. average glucose Bld gHb Est-mCnc: 97 mg/dL
Hgb A1c MFr Bld: 5 % (ref 4.8–5.6)

## 2024-08-22 MED ORDER — VITAMIN D (ERGOCALCIFEROL) 1.25 MG (50000 UNIT) PO CAPS: 50000.0000 [IU] | ORAL_CAPSULE | ORAL | 0 refills | Status: AC
